# Patient Record
Sex: Female | Born: 1975 | Race: Black or African American | Hispanic: No | Marital: Single | Smoking: Never smoker
Health system: Southern US, Community
[De-identification: ages and names within clinical notes are randomized; demographics above are authoritative.]

## PROBLEM LIST (undated history)

## (undated) ENCOUNTER — Inpatient Hospital Stay (HOSPITAL_COMMUNITY): Payer: Self-pay

## (undated) DIAGNOSIS — R42 Dizziness and giddiness: Secondary | ICD-10-CM

## (undated) DIAGNOSIS — R519 Headache, unspecified: Secondary | ICD-10-CM

## (undated) DIAGNOSIS — M2550 Pain in unspecified joint: Secondary | ICD-10-CM

## (undated) DIAGNOSIS — N390 Urinary tract infection, site not specified: Secondary | ICD-10-CM

## (undated) DIAGNOSIS — M545 Low back pain, unspecified: Secondary | ICD-10-CM

## (undated) DIAGNOSIS — R600 Localized edema: Secondary | ICD-10-CM

## (undated) DIAGNOSIS — I10 Essential (primary) hypertension: Secondary | ICD-10-CM

## (undated) DIAGNOSIS — R51 Headache: Secondary | ICD-10-CM

## (undated) DIAGNOSIS — R0602 Shortness of breath: Secondary | ICD-10-CM

## (undated) DIAGNOSIS — M79602 Pain in left arm: Secondary | ICD-10-CM

## (undated) DIAGNOSIS — E739 Lactose intolerance, unspecified: Secondary | ICD-10-CM

## (undated) DIAGNOSIS — F419 Anxiety disorder, unspecified: Secondary | ICD-10-CM

## (undated) HISTORY — PX: WISDOM TOOTH EXTRACTION: SHX21

## (undated) HISTORY — DX: Low back pain, unspecified: M54.50

## (undated) HISTORY — DX: Dizziness and giddiness: R42

## (undated) HISTORY — DX: Pain in unspecified joint: M25.50

## (undated) HISTORY — DX: Lactose intolerance, unspecified: E73.9

## (undated) HISTORY — DX: Localized edema: R60.0

## (undated) HISTORY — DX: Shortness of breath: R06.02

## (undated) HISTORY — DX: Pain in left arm: M79.602

## (undated) HISTORY — PX: TONSILLECTOMY: SUR1361

## (undated) HISTORY — DX: Headache, unspecified: R51.9

## (undated) HISTORY — DX: Anxiety disorder, unspecified: F41.9

## (undated) HISTORY — DX: Essential (primary) hypertension: I10

---
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
## 1998-02-24 ENCOUNTER — Inpatient Hospital Stay (HOSPITAL_COMMUNITY): Admission: AD | Admit: 1998-02-24 | Discharge: 1998-02-24 | Payer: Self-pay | Admitting: Obstetrics & Gynecology

## 1998-03-07 ENCOUNTER — Inpatient Hospital Stay (HOSPITAL_COMMUNITY): Admission: AD | Admit: 1998-03-07 | Discharge: 1998-03-07 | Payer: Self-pay | Admitting: *Deleted

## 1998-05-15 ENCOUNTER — Emergency Department (HOSPITAL_COMMUNITY): Admission: EM | Admit: 1998-05-15 | Discharge: 1998-05-15 | Payer: Self-pay | Admitting: Emergency Medicine

## 1999-01-24 ENCOUNTER — Inpatient Hospital Stay (HOSPITAL_COMMUNITY): Admission: AD | Admit: 1999-01-24 | Discharge: 1999-01-24 | Payer: Self-pay | Admitting: Obstetrics

## 1999-04-08 ENCOUNTER — Inpatient Hospital Stay (HOSPITAL_COMMUNITY): Admission: AD | Admit: 1999-04-08 | Discharge: 1999-04-08 | Payer: Self-pay | Admitting: Obstetrics & Gynecology

## 2001-01-14 ENCOUNTER — Emergency Department (HOSPITAL_COMMUNITY): Admission: EM | Admit: 2001-01-14 | Discharge: 2001-01-14 | Payer: Self-pay | Admitting: Emergency Medicine

## 2001-02-08 ENCOUNTER — Inpatient Hospital Stay (HOSPITAL_COMMUNITY): Admission: AD | Admit: 2001-02-08 | Discharge: 2001-02-08 | Payer: Self-pay | Admitting: Obstetrics & Gynecology

## 2001-02-09 ENCOUNTER — Encounter: Payer: Self-pay | Admitting: Emergency Medicine

## 2001-02-09 ENCOUNTER — Emergency Department (HOSPITAL_COMMUNITY): Admission: EM | Admit: 2001-02-09 | Discharge: 2001-02-09 | Payer: Self-pay | Admitting: Emergency Medicine

## 2001-02-10 ENCOUNTER — Inpatient Hospital Stay (HOSPITAL_COMMUNITY): Admission: EM | Admit: 2001-02-10 | Discharge: 2001-02-11 | Payer: Self-pay | Admitting: Emergency Medicine

## 2001-02-10 ENCOUNTER — Encounter: Payer: Self-pay | Admitting: Emergency Medicine

## 2001-02-11 ENCOUNTER — Encounter: Payer: Self-pay | Admitting: Internal Medicine

## 2002-12-05 ENCOUNTER — Emergency Department (HOSPITAL_COMMUNITY): Admission: EM | Admit: 2002-12-05 | Discharge: 2002-12-05 | Payer: Self-pay | Admitting: Emergency Medicine

## 2003-05-02 ENCOUNTER — Emergency Department (HOSPITAL_COMMUNITY): Admission: EM | Admit: 2003-05-02 | Discharge: 2003-05-02 | Payer: Self-pay | Admitting: Emergency Medicine

## 2003-07-01 ENCOUNTER — Emergency Department (HOSPITAL_COMMUNITY): Admission: EM | Admit: 2003-07-01 | Discharge: 2003-07-01 | Payer: Self-pay | Admitting: Emergency Medicine

## 2003-11-10 ENCOUNTER — Encounter: Admission: RE | Admit: 2003-11-10 | Discharge: 2004-02-08 | Payer: Self-pay | Admitting: Internal Medicine

## 2004-02-20 ENCOUNTER — Emergency Department (HOSPITAL_COMMUNITY): Admission: EM | Admit: 2004-02-20 | Discharge: 2004-02-21 | Payer: Self-pay | Admitting: Emergency Medicine

## 2004-03-01 ENCOUNTER — Encounter: Admission: RE | Admit: 2004-03-01 | Discharge: 2004-05-30 | Payer: Self-pay | Admitting: Internal Medicine

## 2004-04-12 ENCOUNTER — Emergency Department (HOSPITAL_COMMUNITY): Admission: EM | Admit: 2004-04-12 | Discharge: 2004-04-12 | Payer: Self-pay | Admitting: Emergency Medicine

## 2007-12-25 ENCOUNTER — Emergency Department (HOSPITAL_COMMUNITY): Admission: EM | Admit: 2007-12-25 | Discharge: 2007-12-25 | Payer: Self-pay | Admitting: Emergency Medicine

## 2008-04-13 ENCOUNTER — Emergency Department (HOSPITAL_COMMUNITY): Admission: EM | Admit: 2008-04-13 | Discharge: 2008-04-14 | Payer: Self-pay | Admitting: Emergency Medicine

## 2008-09-22 ENCOUNTER — Emergency Department (HOSPITAL_COMMUNITY): Admission: EM | Admit: 2008-09-22 | Discharge: 2008-09-22 | Payer: Self-pay | Admitting: Emergency Medicine

## 2010-09-08 ENCOUNTER — Encounter: Payer: Self-pay | Admitting: Neurology

## 2010-09-14 ENCOUNTER — Inpatient Hospital Stay (HOSPITAL_COMMUNITY)
Admission: AD | Admit: 2010-09-14 | Discharge: 2010-09-14 | Payer: Self-pay | Source: Home / Self Care | Attending: Obstetrics and Gynecology | Admitting: Obstetrics and Gynecology

## 2010-09-14 LAB — URINE MICROSCOPIC-ADD ON

## 2010-09-14 LAB — URINALYSIS, ROUTINE W REFLEX MICROSCOPIC
Nitrite: POSITIVE — AB
Specific Gravity, Urine: 1.025 (ref 1.005–1.030)
pH: 6.5 (ref 5.0–8.0)

## 2010-09-15 LAB — GC/CHLAMYDIA PROBE AMP, GENITAL: Chlamydia, DNA Probe: NEGATIVE

## 2010-10-25 ENCOUNTER — Inpatient Hospital Stay (HOSPITAL_COMMUNITY)
Admission: AD | Admit: 2010-10-25 | Discharge: 2010-10-25 | Disposition: A | Discharge status: Home / Self Care | Payer: Self-pay | Source: Ambulatory Visit | Attending: Obstetrics & Gynecology | Admitting: Obstetrics & Gynecology

## 2010-10-25 DIAGNOSIS — N764 Abscess of vulva: Secondary | ICD-10-CM

## 2010-12-10 ENCOUNTER — Inpatient Hospital Stay (HOSPITAL_COMMUNITY)
Admission: AD | Admit: 2010-12-10 | Discharge: 2010-12-10 | Disposition: A | Discharge status: Home / Self Care | Payer: Self-pay | Source: Ambulatory Visit | Attending: Family Medicine | Admitting: Family Medicine

## 2010-12-10 DIAGNOSIS — N390 Urinary tract infection, site not specified: Secondary | ICD-10-CM

## 2010-12-10 DIAGNOSIS — R300 Dysuria: Secondary | ICD-10-CM | POA: Insufficient documentation

## 2010-12-10 LAB — URINALYSIS, ROUTINE W REFLEX MICROSCOPIC
Bilirubin Urine: NEGATIVE
Nitrite: POSITIVE — AB
Specific Gravity, Urine: >1.03 — ABNORMAL HIGH (ref 1.005–1.030)
Urobilinogen, UA: 1 mg/dL (ref 0.0–1.0)

## 2010-12-10 LAB — POCT PREGNANCY, URINE: Preg Test, Ur: NEGATIVE

## 2010-12-10 LAB — URINE MICROSCOPIC-ADD ON

## 2010-12-12 LAB — URINE CULTURE: Colony Count: >=100000

## 2011-01-02 ENCOUNTER — Inpatient Hospital Stay (HOSPITAL_COMMUNITY)
Admission: AD | Admit: 2011-01-02 | Discharge: 2011-01-02 | Disposition: A | Discharge status: Home / Self Care | Payer: Self-pay | Source: Ambulatory Visit | Attending: Family Medicine | Admitting: Family Medicine

## 2011-01-02 DIAGNOSIS — N949 Unspecified condition associated with female genital organs and menstrual cycle: Secondary | ICD-10-CM

## 2011-01-02 DIAGNOSIS — S3140XA Unspecified open wound of vagina and vulva, initial encounter: Secondary | ICD-10-CM | POA: Insufficient documentation

## 2011-01-02 LAB — WET PREP, GENITAL
Clue Cells Wet Prep HPF POC: NONE SEEN
Trich, Wet Prep: NONE SEEN
Yeast Wet Prep HPF POC: NONE SEEN

## 2011-01-02 LAB — URINALYSIS, ROUTINE W REFLEX MICROSCOPIC
Bilirubin Urine: NEGATIVE
Glucose, UA: NEGATIVE mg/dL
Hgb urine dipstick: NEGATIVE
Ketones, ur: NEGATIVE mg/dL
Nitrite: NEGATIVE
pH: 6 (ref 5.0–8.0)

## 2011-01-02 LAB — URINE MICROSCOPIC-ADD ON

## 2011-01-02 LAB — POCT PREGNANCY, URINE: Preg Test, Ur: NEGATIVE

## 2011-01-04 NOTE — Discharge Summary (Signed)
[place]  Patient:    Stacy Thompson, Stacy Thompson                     MRN: [id number] Adm. Date:  [id number] Disc. Date: 02/11/01 Attending:  Devoria Albe Dictator:   Cornell Barman, P.A. CC:         Sonda Primes, M.D. [facility]   Discharge Summary  DISCHARGE DIAGNOSIS:  Stevens-Johnson syndrome.  BRIEF ADMISSION HISTORY:  The patient is a 35 year old African-American female who presented to the emergency room on February 09, 2001.  She was noted to have ________ blistering with rash.  She also had a fever.  She was diagnosed with herpes zoster and started on acyclovir and Vicodin at that time.  The patient returned to the emergency room on February 10, 2001 with vomiting and a fever of 104 degrees rectally.  PAST MEDICAL HISTORY:  History of chicken pox when she was in the 12th grade that resulted in temporary paralysis.  ALLERGIES:  SULFA.  HOSPITAL COURSE:  #1 - DERMATOLOGY.  The patient presented with what looks like Stevens-Johnson syndrome, etiology is not clear, but it does appear that one week prior to this admission she did take a multivitamin and she also used a topical terfenadine for vaginitis on Sunday.  The patient was admitted for stabilization and transferred to [facility].  She was empirically started on acyclovir and Fortaz.  Arrangements are currently under way for the patient to be transferred to [facility].  PHYSICAL EXAMINATION AT DISCHARGE:  Temperature is 101.9, blood pressure 110/50, heart rate 115, respirations 20, and O2 saturations are 98% on room air.  In general, this is an obese African-American female in obvious discomfort, currently sedated.  HEENT reveals sluffing of all mucous membranes.  LABORATORY DATA:  These were from admission - sodium 135, potassium 3.6, chloride 104, glucose 155, creatinine 1.3.  PLAN:  The patient will be transferred to [facility] Burn Unit for further treatment and  management at the direction of Dr. Posey Rea. DD:  02/11/01 TD:  02/11/01 Job: [id number]

## 2011-01-04 NOTE — H&P (Signed)
[place]  Patient:    Stacy Thompson, Stacy Thompson                     MRN: [id number] Adm. Date:  [id number] Attending:  Devoria Albe                         History and Physical  DATE OF BIRTH:  September 18, 1975.  CHIEF COMPLAINT:  Rash, fever, and headache.  HISTORY OF PRESENT ILLNESS:  The patient is a 35 year old black female who started to have headache and somewhat sore throat on Saturday.  Later she developed fever.  On Sunday she went to [facility] and was diagnosed with vaginitis. She was prescribed topical ____________.  She came to the emergency room on February 09, 2001, with rash inside her mouth and increasing fever with headache. She was diagnosed with herpes zoster and prescribed oral acyclovir and Vicodin for pain. She continues to feel worse today. She developed rash all over her body with blisters on the back.  Her fever went up substantially and she vomited several times.  In the ER her temperature was 104.9 rectally. She received 1000 mg of Tylenol.  Triage checked her fever as 106.  CURRENT MEDICATIONS:  As above. She states that about a week ago she started to take multivitamin, like Centrum and ___________ as above as well.  PAST MEDICAL HISTORY:  Chickenpox in 12th grade.  Apparently with it, she had some facial paralysis temporary that resolved.  ALLERGIES:  SULFA.  SOCIAL HISTORY:  She is single. She does not smoke or drink alcohol.  She is a Consulting civil engineer. She has no children.  FAMILY HISTORY:  Father has hypertension.  REVIEW OF SYSTEMS:  No infectious exposure.  No exposure to animals.  No recent travel.  Family members are okay.  Medications as above.  Last menstrual period was two weeks ago.  No syncope. The rest is as above.  PHYSICAL EXAMINATION:  GENERAL APPEARANCE:  She is in moderate to severe acute distress, appears ill.  VITAL SIGNS:  Temperature 104.9, blood pressure 111/70, heart rate 135, respiratory rate 36, O2  sats 100%.  HEENT:  Severe conjunctivitis with purulent exudate.  Lips swollen with ulcerations.  Mouth with ulcerations and exudate covering her tongue and oropharynx.  NECK:  Supple. There are no meningeal signs.  SKIN:  Papular rash over her upper extremities, trunk, back, sparing the lower extremities.  There is an area of erosions in the back x 2, one measuring about 5 x 3 cm, one measuring 2 x 2 cm.  Vaginal area examined and reveals no lesions.  LUNGS:  Decreased breath sounds.  No wheezes or rales.  CARDIOVASCULAR:  S1 and S2, tachycardic.  ABDOMEN:  Soft, nontender, no organomegaly, no masses felt.  EXTREMITIES:  Lower extremities without edema.  NEUROLOGICAL:  She is alert and oriented and cooperative.  She appears tired.  LAB WORK: Chest x-ray with atelectasis. Cervical film with enlarged epiglottis.  Labs on February 09, 2001, revealed UA normal.  Pregnancy test negative.  Wet prep with few yeast.  Today her white count is 7.2, platelet count 125, hemoglobin 14.0.  Blood cultures obtained.  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  Dermatology consult, infectious    disease consult tomorrow.  May need a burn unit transfer if worse.  Will    start aggressively with IV fluids, empiric acyclovir at 10 mg/kg q.8h.  IV, empiric Fortaz 1 g q.8h.  IV steroids.  Etiology is unclear.  As I    mentioned above, she took a multivitamin about a week ago. She used    topical _____________ for vaginitis on Sunday.  Her last period was two    weeks ago. 2. Dehydration. We will use IV fluids aggressively, intensive care unit. 3. Rash, erosions, mouth ulcers.  Skin biopsy tomorrow. 4. Discussed with her parents the severity of her condition and they seem    to understand. DD:  02/11/01 TD:  02/11/01 Job: [id number]

## 2011-01-17 ENCOUNTER — Inpatient Hospital Stay (HOSPITAL_COMMUNITY)
Admission: AD | Admit: 2011-01-17 | Discharge: 2011-01-17 | Disposition: A | Discharge status: Home / Self Care | Payer: Self-pay | Source: Ambulatory Visit | Attending: Obstetrics and Gynecology | Admitting: Obstetrics and Gynecology

## 2011-01-17 ENCOUNTER — Inpatient Hospital Stay (HOSPITAL_COMMUNITY): Payer: Self-pay

## 2011-01-17 DIAGNOSIS — R109 Unspecified abdominal pain: Secondary | ICD-10-CM

## 2011-01-17 LAB — URINALYSIS, ROUTINE W REFLEX MICROSCOPIC
Nitrite: NEGATIVE
Specific Gravity, Urine: 1.025 (ref 1.005–1.030)
Urobilinogen, UA: 1 mg/dL (ref 0.0–1.0)
pH: 6 (ref 5.0–8.0)

## 2011-01-17 LAB — CBC
HCT: 38.6 % (ref 36.0–46.0)
MCH: 27.5 pg (ref 26.0–34.0)
MCV: 83.7 fL (ref 78.0–100.0)
Platelets: 223 10*3/uL (ref 150–400)
RDW: 14.1 % (ref 11.5–15.5)

## 2011-01-17 LAB — URINE MICROSCOPIC-ADD ON

## 2011-01-17 LAB — POCT PREGNANCY, URINE: Preg Test, Ur: NEGATIVE

## 2011-01-17 LAB — WET PREP, GENITAL

## 2011-01-18 LAB — GC/CHLAMYDIA PROBE AMP, GENITAL: Chlamydia, DNA Probe: NEGATIVE

## 2011-01-18 LAB — URINE CULTURE: Colony Count: 15000

## 2011-10-20 ENCOUNTER — Encounter (HOSPITAL_COMMUNITY): Payer: Self-pay | Admitting: *Deleted

## 2011-10-20 ENCOUNTER — Inpatient Hospital Stay (HOSPITAL_COMMUNITY)
Admission: AD | Admit: 2011-10-20 | Discharge: 2011-10-20 | Disposition: A | Discharge status: Home Health | Payer: Self-pay | Source: Ambulatory Visit | Attending: Obstetrics & Gynecology | Admitting: Obstetrics & Gynecology

## 2011-10-20 DIAGNOSIS — R300 Dysuria: Secondary | ICD-10-CM | POA: Insufficient documentation

## 2011-10-20 DIAGNOSIS — B373 Candidiasis of vulva and vagina: Secondary | ICD-10-CM

## 2011-10-20 DIAGNOSIS — B3731 Acute candidiasis of vulva and vagina: Secondary | ICD-10-CM | POA: Insufficient documentation

## 2011-10-20 HISTORY — DX: Urinary tract infection, site not specified: N39.0

## 2011-10-20 LAB — URINALYSIS, ROUTINE W REFLEX MICROSCOPIC
Bilirubin Urine: NEGATIVE
Ketones, ur: NEGATIVE mg/dL
Leukocytes, UA: NEGATIVE
Nitrite: NEGATIVE
Protein, ur: NEGATIVE mg/dL
Urobilinogen, UA: 0.2 mg/dL (ref 0.0–1.0)
pH: 6 (ref 5.0–8.0)

## 2011-10-20 LAB — WET PREP, GENITAL

## 2011-10-20 LAB — POCT PREGNANCY, URINE: Preg Test, Ur: NEGATIVE

## 2011-10-20 MED ORDER — FLUCONAZOLE 150 MG PO TABS: 150.0000 mg | ORAL_TABLET | Freq: Once | ORAL | Status: AC

## 2011-10-20 NOTE — ED Provider Notes (Signed)
Medical Screening exam and patient care preformed by advanced practice provider.  Agree with the above management.  

## 2011-10-20 NOTE — Progress Notes (Addendum)
Pt reports having frequent urination that has been going on for 1-2 week. Was burning a few days ago took left over pyridium.  Not burniong anymore but still having frequentcy.  Also feels like she had a yeast infection took OTC  Med for yeast but still has symptoms as well (iirtation). Pt also has an abscess on back of left leg near buttocks that is painful.

## 2011-10-20 NOTE — Progress Notes (Signed)
Pt c/o uti symptoms, left leg abcess, vag yeast infection and left eye infection for the past few days.

## 2011-10-20 NOTE — ED Provider Notes (Signed)
History     Chief Complaint  Patient presents with  . Urinary Tract Infection   HPI 36 y.o. G0P0 with "uti symptoms" x 1-2 weeks, had burning with urination, took pyridium at home and burning has since stopped, still has frequency. Also c/o vaginal discharge and irritation - tried OTC Monistat and terazol, both 3 day courses, with no relief. Also c/o "abcess" on back of left thigh, tender area, no drainage or redness. Also c/o weeping from left eye, denies pain, itching, redness, crusting. Pt also states she has not had an annual exam in several years.    Past Medical History  Diagnosis Date  . UTI (lower urinary tract infection)     Past Surgical History  Procedure Date  . Tonsillectomy   . Wisdom tooth extraction     Family History  Problem Relation Age of Onset  . Hypertension Father     History  Substance Use Topics  . Smoking status: Never Smoker   . Smokeless tobacco: Not on file  . Alcohol Use: Yes     socially    Allergies:  Allergies  Allergen Reactions  . Sulfa Antibiotics Hives, Shortness Of Breath and Swelling    No prescriptions prior to admission    Review of Systems  Constitutional: Negative.   HENT: Positive for ear discharge.   Respiratory: Negative.   Cardiovascular: Negative.   Gastrointestinal: Negative for nausea, vomiting, abdominal pain, diarrhea and constipation.  Genitourinary: Positive for urgency. Negative for dysuria, frequency, hematuria and flank pain.       Negative for vaginal bleeding, + vaginal discharge and irritation  Musculoskeletal: Negative.   Neurological: Negative.   Psychiatric/Behavioral: Negative.    Physical Exam   Blood pressure 140/87, pulse 87, temperature 98.4 F (36.9 C), temperature source Oral, resp. rate 16, height 5\' 4"  (1.626 m), weight 298 lb 12.8 oz (135.535 kg), last menstrual period 10/01/2011.  Physical Exam  Constitutional: She is oriented to person, place, and time. She appears well-developed  and well-nourished. No distress.       Morbidly obese   HENT:  Head: Normocephalic and atraumatic.  Cardiovascular: Normal rate, regular rhythm and normal heart sounds.   Respiratory: Effort normal and breath sounds normal. No respiratory distress.  GI: Soft. Bowel sounds are normal. She exhibits no distension and no mass. There is no tenderness. There is no rebound and no guarding.  Genitourinary: There is no rash or lesion on the right labia. There is no rash or lesion on the left labia. Cervix exhibits no discharge and no friability. No erythema, tenderness or bleeding around the vagina. Vaginal discharge (yeasty) found.  Neurological: She is alert and oriented to person, place, and time.  Skin: Skin is warm and dry.  Psychiatric: She has a normal mood and affect.    MAU Course  Procedures  Results for orders placed during the hospital encounter of 10/20/11 (from the past 24 hour(s))  URINALYSIS, ROUTINE W REFLEX MICROSCOPIC     Status: Abnormal   Collection Time   10/20/11  2:50 PM      Component Value Range   Color, Urine YELLOW  YELLOW    APPearance CLEAR  CLEAR    Specific Gravity, Urine >1.030 (*) 1.005 - 1.030    pH 6.0  5.0 - 8.0    Glucose, UA NEGATIVE  NEGATIVE (mg/dL)   Hgb urine dipstick NEGATIVE  NEGATIVE    Bilirubin Urine NEGATIVE  NEGATIVE    Ketones, ur NEGATIVE  NEGATIVE (mg/dL)  Protein, ur NEGATIVE  NEGATIVE (mg/dL)   Urobilinogen, UA 0.2  0.0 - 1.0 (mg/dL)   Nitrite NEGATIVE  NEGATIVE    Leukocytes, UA NEGATIVE  NEGATIVE   POCT PREGNANCY, URINE     Status: Normal   Collection Time   10/20/11  3:23 PM      Component Value Range   Preg Test, Ur NEGATIVE  NEGATIVE   WET PREP, GENITAL     Status: Abnormal   Collection Time   10/20/11  3:40 PM      Component Value Range   Yeast Wet Prep HPF POC NONE SEEN  NONE SEEN    Trich, Wet Prep NONE SEEN  NONE SEEN    Clue Cells Wet Prep HPF POC NONE SEEN  NONE SEEN    WBC, Wet Prep HPF POC FEW (*) NONE SEEN       Assessment and Plan  36 y.o. G0P0 with apparent yeast infection, rx diflucan 150 mg, 1 tab today, repeat in 2 days F/U in GYN clinic for annual exam, encouraged pt to have follow up with PCP or to request blood sugar check at annual exam  Najmo Pardue 10/20/2011, 4:28 PM

## 2011-10-20 NOTE — Discharge Instructions (Signed)
You should follow up for your annual exam. I would encourage you to have your blood sugar and cholesterol checked when you follow up.

## 2011-11-25 ENCOUNTER — Emergency Department (HOSPITAL_COMMUNITY)
Admission: EM | Admit: 2011-11-25 | Discharge: 2011-11-25 | Disposition: A | Discharge status: Home / Self Care | Payer: Self-pay | Attending: Emergency Medicine | Admitting: Emergency Medicine

## 2011-11-25 ENCOUNTER — Encounter (HOSPITAL_COMMUNITY): Payer: Self-pay | Admitting: *Deleted

## 2011-11-25 DIAGNOSIS — R51 Headache: Secondary | ICD-10-CM | POA: Insufficient documentation

## 2011-11-25 DIAGNOSIS — R509 Fever, unspecified: Secondary | ICD-10-CM | POA: Insufficient documentation

## 2011-11-25 DIAGNOSIS — J329 Chronic sinusitis, unspecified: Secondary | ICD-10-CM | POA: Insufficient documentation

## 2011-11-25 DIAGNOSIS — J3489 Other specified disorders of nose and nasal sinuses: Secondary | ICD-10-CM | POA: Insufficient documentation

## 2011-11-25 MED ORDER — AMOXICILLIN-POT CLAVULANATE 875-125 MG PO TABS: 1.0000 | ORAL_TABLET | Freq: Two times a day (BID) | ORAL | Status: AC

## 2011-11-25 NOTE — ED Provider Notes (Signed)
History     CSN: [id number]  Arrival date & time 11/25/11  1901   First MD Initiated Contact with Patient 11/25/11 1943      Chief Complaint  Patient presents with  . Facial Pain    (Consider location/radiation/quality/duration/timing/severity/associated sxs/prior treatment) HPI Comments: Patient here with a two week history of maxillary facial pain, nasal congestion and fever which started 2 days ago - she reports thick green nasal drainage now with streaks of blood as well.  Denies nausea, vomiting, diarrhea, reports mild headache along with this.  Patient is a 36 y.o. female presenting with sinusitis. The history is provided by the patient. No language interpreter was used.  Sinusitis  This is a new problem. The current episode started more than 1 week ago. The problem has been gradually worsening. The maximum temperature recorded prior to her arrival was 100 to 100.9 F. The fever has been present for 1 to 2 days. The pain is at a severity of 6/10. The pain is moderate. The pain has been constant since onset. Associated symptoms include congestion, sinus pressure and cough. Pertinent negatives include no chills, no sweats, no ear pain, no hoarse voice, no sore throat, no swollen glands and no shortness of breath. She has tried other medications for the symptoms. The treatment provided mild relief.    Past Medical History  Diagnosis Date  . UTI (lower urinary tract infection)     Past Surgical History  Procedure Date  . Tonsillectomy   . Wisdom tooth extraction     Family History  Problem Relation Age of Onset  . Hypertension Father     History  Substance Use Topics  . Smoking status: Never Smoker   . Smokeless tobacco: Not on file  . Alcohol Use: Yes     socially    OB History    Grav Para Term Preterm Abortions TAB SAB Ect Mult Living   0               Review of Systems  Constitutional: Negative for chills.  HENT: Positive for congestion and sinus pressure.  Negative for ear pain, sore throat and hoarse voice.   Respiratory: Positive for cough. Negative for shortness of breath.   All other systems reviewed and are negative.    Allergies  Sulfa antibiotics  Home Medications   Current Outpatient Rx  Name Route Sig Dispense Refill  . ACETAMINOPHEN 500 MG PO TABS Oral Take 1,000 mg by mouth every 6 (six) hours as needed. For pain    . GOODY HEADACHE PO Oral Take 1 packet by mouth daily as needed. For headache    . GLYCERIN-HYPROMELLOSE-PEG 400 0.2-0.2-1 % OP SOLN Ophthalmic Apply 1-2 drops to eye as needed. For dry eyes    . ADULT MULTIVITAMIN W/MINERALS CH Oral Take 1 tablet by mouth daily.      BP 124/81  Pulse 76  Temp(Src) 98.6 F (37 C) (Oral)  Resp 18  SpO2 100%  LMP 10/31/2011  Physical Exam  Nursing note and vitals reviewed. Constitutional: She is oriented to person, place, and time. She appears well-developed and well-nourished. No distress.  HENT:  Head: Normocephalic and atraumatic.  Right Ear: External ear normal.  Left Ear: External ear normal.  Nose: Mucosal edema and rhinorrhea present. No septal deviation or nasal septal hematoma. No epistaxis. Right sinus exhibits maxillary sinus tenderness. Right sinus exhibits no frontal sinus tenderness. Left sinus exhibits maxillary sinus tenderness. Left sinus exhibits no frontal sinus tenderness.  Mouth/Throat: Oropharynx is clear and moist. No oropharyngeal exudate.  Eyes: Conjunctivae are normal. Pupils are equal, round, and reactive to light. No scleral icterus.  Neck: Normal range of motion. Neck supple.  Cardiovascular: Normal rate, regular rhythm and normal heart sounds.  Exam reveals no gallop and no friction rub.   No murmur heard. Pulmonary/Chest: Effort normal and breath sounds normal. No respiratory distress. She exhibits no tenderness.  Abdominal: Soft. Bowel sounds are normal. She exhibits no distension. There is no tenderness.  Musculoskeletal: Normal range of  motion. She exhibits no edema and no tenderness.  Lymphadenopathy:    She has no cervical adenopathy.  Neurological: She is alert and oriented to person, place, and time. No cranial nerve deficit.  Skin: Skin is warm and dry. No rash noted. No erythema. No pallor.  Psychiatric: She has a normal mood and affect. Her behavior is normal. Judgment and thought content normal.    ED Course  Procedures (including critical care time)  Labs Reviewed - No data to display No results found.   Sinusitis    MDM  Patient with acute sinisitis to maxillary sinuses - will place on abx and patient will continue OTC medication for symptoms.       Izola Price [place], [place] 11/25/11 2011

## 2011-11-25 NOTE — Discharge Instructions (Signed)

## 2011-11-25 NOTE — ED Notes (Signed)
The pt thinks she has a sinus infection.  She has pressure in her sinus eyes draining and she has not felt well for one week

## 2011-11-25 NOTE — ED Notes (Signed)
Patient complaining of a sinus infection for the last two weeks; patient reports fever within the last 24 hours.  Patient reports sinus pressure, green drainage out of her eyes, and headache.  Patient states that she has problems with seasonal allergies, but that she only has sinus trouble every three to four years.  Patient alert and oriented x4; PERRL present. Will continue to monitor.

## 2011-11-25 NOTE — ED Notes (Signed)
Patient given discharge paperwork; went over discharge instructions with patient. Patient instructed to take Augmentin as directed and to finish entire prescription.  Patient instructed to follow up with her primary care physician and to return to the ED for new, worsening, or concerning symptoms.

## 2011-11-26 NOTE — ED Provider Notes (Signed)
Medical screening examination/treatment/procedure(s) were performed by non-physician practitioner and as supervising physician I was immediately available for consultation/collaboration.  Juliet Rude. Rubin Payor, MD 11/26/11 2256

## 2012-06-01 ENCOUNTER — Other Ambulatory Visit: Payer: Self-pay

## 2012-06-01 ENCOUNTER — Emergency Department (HOSPITAL_COMMUNITY): Payer: Self-pay

## 2012-06-01 ENCOUNTER — Encounter (HOSPITAL_COMMUNITY): Payer: Self-pay | Admitting: Emergency Medicine

## 2012-06-01 ENCOUNTER — Emergency Department (HOSPITAL_COMMUNITY)
Admission: EM | Admit: 2012-06-01 | Discharge: 2012-06-01 | Disposition: A | Discharge status: Home / Self Care | Payer: Self-pay | Attending: Emergency Medicine | Admitting: Emergency Medicine

## 2012-06-01 DIAGNOSIS — R079 Chest pain, unspecified: Secondary | ICD-10-CM | POA: Insufficient documentation

## 2012-06-01 DIAGNOSIS — R002 Palpitations: Secondary | ICD-10-CM | POA: Insufficient documentation

## 2012-06-01 DIAGNOSIS — F411 Generalized anxiety disorder: Secondary | ICD-10-CM | POA: Insufficient documentation

## 2012-06-01 DIAGNOSIS — R209 Unspecified disturbances of skin sensation: Secondary | ICD-10-CM | POA: Insufficient documentation

## 2012-06-01 DIAGNOSIS — F419 Anxiety disorder, unspecified: Secondary | ICD-10-CM

## 2012-06-01 DIAGNOSIS — R51 Headache: Secondary | ICD-10-CM | POA: Insufficient documentation

## 2012-06-01 LAB — POCT I-STAT TROPONIN I: Troponin i, poc: 0 ng/mL (ref 0.00–0.08)

## 2012-06-01 LAB — CBC
HCT: 39.3 % (ref 36.0–46.0)
Hemoglobin: 12.7 g/dL (ref 12.0–15.0)
RBC: 4.66 MIL/uL (ref 3.87–5.11)
WBC: 6.1 10*3/uL (ref 4.0–10.5)

## 2012-06-01 LAB — BASIC METABOLIC PANEL
BUN: 8 mg/dL (ref 6–23)
Chloride: 101 mEq/L (ref 96–112)
Glucose, Bld: 91 mg/dL (ref 70–99)
Potassium: 3.7 mEq/L (ref 3.5–5.1)
Sodium: 137 mEq/L (ref 135–145)

## 2012-06-01 NOTE — Discharge Instructions (Signed)
Followup with one of the resources below to establish care with a primary care provider. Be sure to discuss your anxiety with this provider. Return to the emergency department if your symptoms worsen. Anxiety and Panic Attacks Your caregiver has informed you that you are having an anxiety or panic attack. There may be many forms of this. Most of the time these attacks come suddenly and without warning. They come at any time of day, including periods of sleep, and at any time of life. They may be strong and unexplained. Although panic attacks are very scary, they are physically harmless. Sometimes the cause of your anxiety is not known. Anxiety is a protective mechanism of the body in its fight or flight mechanism. Most of these perceived danger situations are actually nonphysical situations (such as anxiety over losing a job). CAUSES  The causes of an anxiety or panic attack are many. Panic attacks may occur in otherwise healthy people given a certain set of circumstances. There may be a genetic cause for panic attacks. Some medications may also have anxiety as a side effect. SYMPTOMS  Some of the most common feelings are:  Intense terror.  Dizziness, feeling faint.  Hot and cold flashes.  Fear of going crazy.  Feelings that nothing is real.  Sweating.  Shaking.  Chest pain or a fast heartbeat (palpitations).  Smothering, choking sensations.  Feelings of impending doom and that death is near.  Tingling of extremities, this may be from over-breathing.  Altered reality (derealization).  Being detached from yourself (depersonalization). Several symptoms can be present to make up anxiety or panic attacks. DIAGNOSIS  The evaluation by your caregiver will depend on the type of symptoms you are experiencing. The diagnosis of anxiety or panic attack is made when no physical illness can be determined to be a cause of the symptoms. TREATMENT  Treatment to prevent anxiety and panic attacks  may include:  Avoidance of circumstances that cause anxiety.  Reassurance and relaxation.  Regular exercise.  Relaxation therapies, such as yoga.  Psychotherapy with a psychiatrist or therapist.  Avoidance of caffeine, alcohol and illegal drugs.  Prescribed medication. SEEK IMMEDIATE MEDICAL CARE IF:   You experience panic attack symptoms that are different than your usual symptoms.  You have any worsening or concerning symptoms. Document Released: 08/05/2005 Document Revised: 10/28/2011 Document Reviewed: 12/07/2009 [facility] Patient Information 2013 [place], [place]. Chest Pain (Nonspecific) It is often hard to give a specific diagnosis for the cause of chest pain. There is always a chance that your pain could be related to something serious, such as a heart attack or a blood clot in the lungs. You need to follow up with your caregiver for further evaluation. CAUSES   Heartburn.  Pneumonia or bronchitis.  Anxiety or stress.  Inflammation around your heart (pericarditis) or lung (pleuritis or pleurisy).  A blood clot in the lung.  A collapsed lung (pneumothorax). It can develop suddenly on its own (spontaneous pneumothorax) or from injury (trauma) to the chest.  Shingles infection (herpes zoster virus). The chest wall is composed of bones, muscles, and cartilage. Any of these can be the source of the pain.  The bones can be bruised by injury.  The muscles or cartilage can be strained by coughing or overwork.  The cartilage can be affected by inflammation and become sore (costochondritis). DIAGNOSIS  Lab tests or other studies, such as X-rays, electrocardiography, stress testing, or cardiac imaging, may be needed to find the cause of your pain.  TREATMENT  Treatment depends on what may be causing your chest pain. Treatment may include:  Acid blockers for heartburn.  Anti-inflammatory medicine.  Pain medicine for inflammatory conditions.  Antibiotics if an  infection is present.  You may be advised to change lifestyle habits. This includes stopping smoking and avoiding alcohol, caffeine, and chocolate.  You may be advised to keep your head raised (elevated) when sleeping. This reduces the chance of acid going backward from your stomach into your esophagus.  Most of the time, nonspecific chest pain will improve within 2 to 3 days with rest and mild pain medicine. HOME CARE INSTRUCTIONS   If antibiotics were prescribed, take your antibiotics as directed. Finish them even if you start to feel better.  For the next few days, avoid physical activities that bring on chest pain. Continue physical activities as directed.  Do not smoke.  Avoid drinking alcohol.  Only take over-the-counter or prescription medicine for pain, discomfort, or fever as directed by your caregiver.  Follow your caregiver's suggestions for further testing if your chest pain does not go away.  Keep any follow-up appointments you made. If you do not go to an appointment, you could develop lasting (chronic) problems with pain. If there is any problem keeping an appointment, you must call to reschedule. SEEK MEDICAL CARE IF:   You think you are having problems from the medicine you are taking. Read your medicine instructions carefully.  Your chest pain does not go away, even after treatment.  You develop a rash with blisters on your chest. SEEK IMMEDIATE MEDICAL CARE IF:   You have increased chest pain or pain that spreads to your arm, neck, jaw, back, or abdomen.  You develop shortness of breath, an increasing cough, or you are coughing up blood.  You have severe back or abdominal pain, feel nauseous, or vomit.  You develop severe weakness, fainting, or chills.  You have a fever. THIS IS AN EMERGENCY. Do not wait to see if the pain will go away. Get medical help at once. Call your local emergency services (911 in [place] Do not drive yourself to the hospital. MAKE  SURE YOU:   Understand these instructions.  Will watch your condition.  Will get help right away if you are not doing well or get worse. Document Released: 05/15/2005 Document Revised: 10/28/2011 Document Reviewed: 03/10/2008 [facility] Patient Information 2013 [place], [place].

## 2012-06-01 NOTE — ED Provider Notes (Signed)
History     CSN: [id number]  Arrival date & time 06/01/12  1605   First MD Initiated Contact with Patient 06/01/12 2046      Chief Complaint  Patient presents with  . Chest Pain    (Consider location/radiation/quality/duration/timing/severity/associated sxs/prior treatment) HPI Comments: 36 year old female with no significant past medical history presents the emergency department with intermittent chest pain for the past 5 weeks. States she has been having on-and-off chest pain lasting a couple seconds at random. The pain is on the left side of her chest described as sharp and nonradiating. She does not have to do anything in particular to alleviate the symptoms, it goes away on its own. Today she had an episode that lasted 15 seconds while she was studying with associated palpitations. She admits to being under increased stress due to studying for school. Admits to associated headache on and off for the past 5 weeks relieved by Tylenol. Denies nausea, vomiting, diaphoresis, shortness of breath, visual disturbance, lightheadedness or dizziness. She is a nonsmoker and has no family history of heart disease. Today will she was studying and sitting on her bed, she states the lateral aspect of her left side went numb for a few minutes, so she stood up and walked around and it went away.  The history is provided by the patient.    Past Medical History  Diagnosis Date  . UTI (lower urinary tract infection)     Past Surgical History  Procedure Date  . Tonsillectomy   . Wisdom tooth extraction     Family History  Problem Relation Age of Onset  . Hypertension Father     History  Substance Use Topics  . Smoking status: Never Smoker   . Smokeless tobacco: Not on file  . Alcohol Use: Yes     socially    OB History    Grav Para Term Preterm Abortions TAB SAB Ect Mult Living   0               Review of Systems  Constitutional: Negative for diaphoresis and appetite change.    HENT: Negative for neck pain and neck stiffness.   Eyes: Negative for visual disturbance.  Respiratory: Negative for cough, chest tightness, shortness of breath and wheezing.   Cardiovascular: Positive for chest pain and palpitations. Negative for leg swelling.  Gastrointestinal: Negative for nausea and vomiting.  Musculoskeletal: Negative for back pain.  Skin: Negative for rash.  Neurological: Positive for numbness and headaches. Negative for dizziness and light-headedness.  Psychiatric/Behavioral: The patient is nervous/anxious.     Allergies  Sulfa antibiotics  Home Medications   Current Outpatient Rx  Name Route Sig Dispense Refill  . ACETAMINOPHEN 500 MG PO TABS Oral Take 1,000 mg by mouth every 6 (six) hours as needed. For pain    . GOODY HEADACHE PO Oral Take 1 packet by mouth daily as needed. For headache    . GLYCERIN-HYPROMELLOSE-PEG 400 0.2-0.2-1 % OP SOLN Ophthalmic Apply 1-2 drops to eye as needed. For dry eyes    . ADULT MULTIVITAMIN W/MINERALS CH Oral Take 1 tablet by mouth daily.      BP 136/77  Pulse 84  Temp 98.1 F (36.7 C) (Oral)  Resp 18  SpO2 100%  LMP 05/28/2012  Physical Exam  Constitutional: She is oriented to person, place, and time. She appears well-developed and well-nourished. No distress.  HENT:  Head: Normocephalic and atraumatic.  Mouth/Throat: Oropharynx is clear and moist.  Eyes: Conjunctivae normal and  EOM are normal. Pupils are equal, round, and reactive to light.  Neck: Normal range of motion. Neck supple.  Cardiovascular: Normal rate, regular rhythm, normal heart sounds and intact distal pulses.        No extremity edema  Pulmonary/Chest: Effort normal and breath sounds normal. No respiratory distress. She has no wheezes. She exhibits no tenderness.  Abdominal: Soft. Bowel sounds are normal. There is no tenderness.  Musculoskeletal: Normal range of motion.  Neurological: She is alert and oriented to person, place, and time. No  sensory deficit.  Skin: Skin is warm and dry. She is not diaphoretic.  Psychiatric: Her speech is normal and behavior is normal. Thought content normal. Her mood appears anxious.    ED Course  Procedures (including critical care time)  Date: 06/01/2012  Rate: 78  Rhythm: normal sinus rhythm with sinus arrhythmia  QRS Axis: normal  Intervals: normal  ST/T Wave abnormalities: normal  Conduction Disutrbances:none  Narrative Interpretation: no stemi, normal ekg  Old EKG Reviewed: unchanged   Labs Reviewed  BASIC METABOLIC PANEL - Abnormal; Notable for the following:    GFR calc non Af Amer 88 (*)     All other components within normal limits  CBC  POCT I-STAT TROPONIN I  POCT I-STAT TROPONIN I   Dg Chest 2 View  06/01/2012  *RADIOLOGY REPORT*  Clinical Data: Left-sided chest pain.  CHEST - 2 VIEW  Comparison: None  Findings: The cardiac silhouette, mediastinal and hilar contours are normal.  The lungs are clear.  No pleural effusion.  The bony thorax is intact.  The S-shaped thoracolumbar scoliosis is noted.  IMPRESSION:  1.  No acute cardiopulmonary findings. 2.  Thoracolumbar scoliotic curvature.   Original Report Authenticated By: P. Loralie Champagne, M.D.      1. Chest pain   2. Anxiety       MDM  36 year old female with intermittent chest pain for the past 5 weeks. Nothing in specific brings on the pain, and is relieved on its own. No significant past medical history or family history. She's under increased stress and anxiety from sitting for school. I will give her a resource list to followup with primary care physician to discuss anxiety. Patient states her understanding of plan and is agreeable. Case discussed with Dr. Rulon Abide who agrees with plan of care.     Trevor Mace, PA-C 06/01/12 2121

## 2012-06-01 NOTE — ED Notes (Signed)
Pt c/o episode of left sided CP that then resolved and now pt having frontal ha and some tingling in left leg; pt denies SOB or vision change

## 2012-09-12 ENCOUNTER — Encounter (HOSPITAL_COMMUNITY): Payer: Self-pay | Admitting: Family

## 2012-09-12 ENCOUNTER — Inpatient Hospital Stay (HOSPITAL_COMMUNITY)
Admission: AD | Admit: 2012-09-12 | Discharge: 2012-09-12 | Disposition: A | Discharge status: Home / Self Care | Payer: Self-pay | Source: Ambulatory Visit | Attending: Obstetrics and Gynecology | Admitting: Obstetrics and Gynecology

## 2012-09-12 DIAGNOSIS — J029 Acute pharyngitis, unspecified: Secondary | ICD-10-CM | POA: Insufficient documentation

## 2012-09-12 DIAGNOSIS — K1370 Unspecified lesions of oral mucosa: Secondary | ICD-10-CM | POA: Insufficient documentation

## 2012-09-12 DIAGNOSIS — R059 Cough, unspecified: Secondary | ICD-10-CM | POA: Insufficient documentation

## 2012-09-12 DIAGNOSIS — J40 Bronchitis, not specified as acute or chronic: Secondary | ICD-10-CM | POA: Insufficient documentation

## 2012-09-12 DIAGNOSIS — K121 Other forms of stomatitis: Secondary | ICD-10-CM

## 2012-09-12 DIAGNOSIS — Z3201 Encounter for pregnancy test, result positive: Secondary | ICD-10-CM | POA: Insufficient documentation

## 2012-09-12 DIAGNOSIS — R05 Cough: Secondary | ICD-10-CM | POA: Insufficient documentation

## 2012-09-12 LAB — POCT PREGNANCY, URINE: Preg Test, Ur: POSITIVE — AB

## 2012-09-12 LAB — WET PREP, GENITAL: Yeast Wet Prep HPF POC: NONE SEEN

## 2012-09-12 MED ORDER — AZITHROMYCIN 250 MG PO TABS: 250.0000 mg | ORAL_TABLET | Freq: Every day | ORAL | Status: DC
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
## 2012-09-12 NOTE — MAU Note (Signed)
Pt reports she has been having sore throat, coughing, runny nose. Might have had a fever the other day. Has a cold sore in her mouth. Has been having these symptoms since she slept with a new partner 1.5 weeks ago.

## 2012-09-12 NOTE — MAU Provider Note (Signed)
Attestation of Attending Supervision of Advanced Practitioner (CNM/NP): Evaluation and management procedures were performed by the Advanced Practitioner under my supervision and collaboration.  I have reviewed the Advanced Practitioner's note and chart, and I agree with the management and plan.  Dealie Koelzer 09/12/2012 9:34 PM
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
## 2012-09-14 LAB — HERPES SIMPLEX VIRUS CULTURE
Culture: NOT DETECTED
Special Requests: NORMAL

## 2012-09-15 LAB — GC/CHLAMYDIA PROBE AMP: GC Probe RNA: NEGATIVE

## 2012-09-20 ENCOUNTER — Inpatient Hospital Stay (HOSPITAL_COMMUNITY): Payer: Self-pay

## 2012-09-20 ENCOUNTER — Encounter (HOSPITAL_COMMUNITY): Payer: Self-pay | Admitting: *Deleted

## 2012-09-20 ENCOUNTER — Inpatient Hospital Stay (HOSPITAL_COMMUNITY)
Admission: AD | Admit: 2012-09-20 | Discharge: 2012-09-20 | Disposition: A | Discharge status: Home / Self Care | Payer: Self-pay | Source: Ambulatory Visit | Attending: Obstetrics & Gynecology | Admitting: Obstetrics & Gynecology

## 2012-09-20 DIAGNOSIS — O200 Threatened abortion: Secondary | ICD-10-CM | POA: Insufficient documentation

## 2012-09-20 LAB — HCG, QUANTITATIVE, PREGNANCY: hCG, Beta Chain, Quant, S: 214 m[IU]/mL — ABNORMAL HIGH (ref <5)

## 2012-09-20 LAB — CBC
Platelets: 193 10*3/uL (ref 150–400)
RDW: 13.6 % (ref 11.5–15.5)
WBC: 7.3 10*3/uL (ref 4.0–10.5)

## 2012-09-20 NOTE — MAU Provider Note (Signed)
History     CSN: [id number]  Arrival date and time: 09/20/12 0048   None     Chief Complaint  Patient presents with  . Vaginal Bleeding  . Abdominal Cramping   HPI  Stacy Thompson is a 37 y.o. G1P0 at [redacted]w[redacted]d who presents today with vaginal bleeding and cramping X 4 days. She states that it has been as a heavy as a period, and she is passing small clots. She has not been saturating more than a pad an hour.   Past Medical History  Diagnosis Date  . UTI (lower urinary tract infection)     Past Surgical History  Procedure Date  . Tonsillectomy   . Wisdom tooth extraction     Family History  Problem Relation Age of Onset  . Hypertension Father     History  Substance Use Topics  . Smoking status: Never Smoker   . Smokeless tobacco: Not on file  . Alcohol Use: Yes     Comment: socially    Allergies:  Allergies  Allergen Reactions  . Sulfa Antibiotics Hives, Shortness Of Breath and Swelling    Prescriptions prior to admission  Medication Sig Dispense Refill  . acetaminophen (TYLENOL) 500 MG tablet Take 1,000 mg by mouth every 6 (six) hours as needed. For pain      . azithromycin (ZITHROMAX) 250 MG tablet Take 1 tablet (250 mg total) by mouth daily. Take first 2 tablets together, then 1 every day until finished.  6 tablet  0  . Glycerin-Hypromellose-PEG 400 (VISINE TEARS) 0.2-0.2-1 % SOLN Apply 1-2 drops to eye as needed. For dry eyes      . OVER THE COUNTER MEDICATION Take 30 mLs by mouth daily as needed. OTC cough syrup; unknown drug/strength        Review of Systems  Constitutional: Negative for fever and chills.  Gastrointestinal: Positive for abdominal pain (cramping). Negative for nausea, vomiting, diarrhea and constipation.  Genitourinary: Negative for dysuria and urgency.  Musculoskeletal: Negative for myalgias.  Neurological: Negative for dizziness.   Physical Exam   Blood pressure 149/76, pulse 86, temperature 97.4 F (36.3 C), temperature source  Oral, resp. rate 16, height 5\' 4"  (1.626 m), weight 294 lb 9.6 oz (133.63 kg), last menstrual period 07/30/2012.  Physical Exam  Nursing note and vitals reviewed. Constitutional: She is oriented to person, place, and time. She appears well-developed. No distress.  Cardiovascular: Normal rate.   Respiratory: Effort normal.  GI: Soft. She exhibits no distension. There is no tenderness.  Neurological: She is alert and oriented to person, place, and time.  Skin: Skin is warm.  Psychiatric: She has a normal mood and affect.    MAU Course  Procedures    Results for orders placed during the hospital encounter of 09/20/12 (from the past 24 hour(s))  CBC     Status: Abnormal   Collection Time   09/20/12  1:08 AM      Component Value Range   WBC 7.3  4.0 - 10.5 K/uL   RBC 4.25  3.87 - 5.11 MIL/uL   Hemoglobin 11.6 (*) 12.0 - 15.0 g/dL   HCT 40.9  81.1 - 91.4 %   MCV 84.9  78.0 - 100.0 fL   MCH 27.3  26.0 - 34.0 pg   MCHC 32.1  30.0 - 36.0 g/dL   RDW 78.2  95.6 - 21.3 %   Platelets 193  150 - 400 K/uL  HCG, QUANTITATIVE, PREGNANCY     Status: Abnormal  Collection Time   09/20/12  1:08 AM      Component Value Range   hCG, Beta Chain, Quant, S 214 (*) <5 mIU/mL   Assessment and Plan   1. Threatened abortion in first trimester    Repeat BHCG on Tuesday (~48 hours) Reviewed bleeding precautions and when to return Comfort measures discussed  Tawnya Crook 09/20/2012, 1:36 AM

## 2012-09-20 NOTE — MAU Note (Signed)
Patient states she has been having abdominal cramping and vaginal bleeding for the last four days. Patient states the bleeding is bright red and as heavy as a menstrual period.Marland Kitchen

## 2012-10-09 ENCOUNTER — Encounter (HOSPITAL_COMMUNITY): Payer: Self-pay | Admitting: *Deleted

## 2012-10-09 ENCOUNTER — Inpatient Hospital Stay (HOSPITAL_COMMUNITY)
Admission: AD | Admit: 2012-10-09 | Discharge: 2012-10-09 | Disposition: A | Discharge status: Home / Self Care | Payer: Self-pay | Source: Ambulatory Visit | Attending: Obstetrics & Gynecology | Admitting: Obstetrics & Gynecology

## 2012-10-09 DIAGNOSIS — N949 Unspecified condition associated with female genital organs and menstrual cycle: Secondary | ICD-10-CM | POA: Insufficient documentation

## 2012-10-09 DIAGNOSIS — R350 Frequency of micturition: Secondary | ICD-10-CM | POA: Insufficient documentation

## 2012-10-09 DIAGNOSIS — N390 Urinary tract infection, site not specified: Secondary | ICD-10-CM | POA: Insufficient documentation

## 2012-10-09 DIAGNOSIS — L293 Anogenital pruritus, unspecified: Secondary | ICD-10-CM | POA: Insufficient documentation

## 2012-10-09 DIAGNOSIS — J40 Bronchitis, not specified as acute or chronic: Secondary | ICD-10-CM | POA: Insufficient documentation

## 2012-10-09 DIAGNOSIS — R05 Cough: Secondary | ICD-10-CM | POA: Insufficient documentation

## 2012-10-09 DIAGNOSIS — B373 Candidiasis of vulva and vagina: Secondary | ICD-10-CM

## 2012-10-09 DIAGNOSIS — B3731 Acute candidiasis of vulva and vagina: Secondary | ICD-10-CM | POA: Insufficient documentation

## 2012-10-09 DIAGNOSIS — R059 Cough, unspecified: Secondary | ICD-10-CM | POA: Insufficient documentation

## 2012-10-09 HISTORY — DX: Headache: R51

## 2012-10-09 LAB — URINALYSIS, ROUTINE W REFLEX MICROSCOPIC
Ketones, ur: NEGATIVE mg/dL
Nitrite: NEGATIVE
Protein, ur: NEGATIVE mg/dL

## 2012-10-09 LAB — URINE MICROSCOPIC-ADD ON

## 2012-10-09 LAB — WET PREP, GENITAL
Trich, Wet Prep: NONE SEEN
Yeast Wet Prep HPF POC: NONE SEEN

## 2012-10-09 MED ORDER — NYSTATIN-TRIAMCINOLONE 100000-0.1 UNIT/GM-% EX OINT: TOPICAL_OINTMENT | Freq: Two times a day (BID) | CUTANEOUS | Status: DC

## 2012-10-09 MED ORDER — FLUCONAZOLE 150 MG PO TABS: ORAL_TABLET | ORAL | Status: DC

## 2012-10-09 MED ORDER — GUAIFENESIN ER 600 MG PO TB12: 1200.0000 mg | ORAL_TABLET | Freq: Two times a day (BID) | ORAL | Status: DC

## 2012-10-09 MED ORDER — AMOXICILLIN 500 MG PO TABS: 500.0000 mg | ORAL_TABLET | Freq: Two times a day (BID) | ORAL | Status: DC

## 2012-10-09 NOTE — MAU Note (Signed)
Burning when she pees started this morning, yesterday noted more frequency and urgency.  Was diagnosed with an SAB in 09-13-2022, states on [place] had passed everything- then a wk later "passed the embryo and could see the umbilical cord". Was given a prescription for a zpac  For an URI, initially seemed to work, now has productive cough with green mucous.

## 2012-10-09 NOTE — MAU Provider Note (Signed)
Attestation of Attending Supervision of Advanced Practitioner (PA/CNM/NP): Evaluation and management procedures were performed by the Advanced Practitioner under my supervision and collaboration.  I have reviewed the Advanced Practitioner's note and chart, and I agree with the management and plan.  Sariya Trickey, MD, FACOG Attending Obstetrician & Gynecologist Faculty Practice, [facility]  

## 2012-10-09 NOTE — MAU Provider Note (Signed)
History     CSN: [id number]  Arrival date and time: 10/09/12 9147   None     Chief Complaint  Patient presents with  . Urinary Frequency   HPI 37 y.o. G1P0010 with cough x 1 month, ongoing after treatment for bronchitis with z pak, productive of greenish mucous. Urinary frequency, urgency since yesterday, burning today, no back pain or n/v. Vaginal itching and discharge "for a while", no improvement with monistat or terazol.    Past Medical History  Diagnosis Date  . UTI (lower urinary tract infection)   . Headache     Past Surgical History  Procedure Laterality Date  . Tonsillectomy    . Wisdom tooth extraction      Family History  Problem Relation Age of Onset  . Hypertension Father     History  Substance Use Topics  . Smoking status: Never Smoker   . Smokeless tobacco: Never Used  . Alcohol Use: Yes     Comment: socially    Allergies:  Allergies  Allergen Reactions  . Sulfa Antibiotics Hives, Shortness Of Breath and Swelling    Prescriptions prior to admission  Medication Sig Dispense Refill  . acetaminophen (TYLENOL) 500 MG tablet Take 1,000 mg by mouth every 6 (six) hours as needed. For pain      . OVER THE COUNTER MEDICATION Take 30 mLs by mouth daily as needed. OTC cough syrup; unknown drug/strength        Review of Systems  Constitutional: Negative.  Negative for fever.  Respiratory: Positive for cough and sputum production. Negative for shortness of breath and wheezing.   Cardiovascular: Negative.   Gastrointestinal: Negative for nausea, vomiting, abdominal pain, diarrhea and constipation.  Genitourinary: Negative for dysuria, urgency, frequency, hematuria and flank pain.       Negative for vaginal bleeding, + vaginal discharge  Musculoskeletal: Negative.   Neurological: Negative.   Psychiatric/Behavioral: Negative.    Physical Exam   Blood pressure 133/76, pulse 77, temperature 98.3 F (36.8 C), temperature source Oral, resp. rate 18,  height 5\' 4"  (1.626 m), weight 290 lb 6.4 oz (131.725 kg), last menstrual period 07/30/2012, SpO2 100.00%, unknown if currently breastfeeding.  Physical Exam  Constitutional: She is oriented to person, place, and time. She appears well-developed and well-nourished. No distress.  HENT:  Head: Normocephalic and atraumatic.  Cardiovascular: Normal rate, regular rhythm and normal heart sounds.   Respiratory: Effort normal and breath sounds normal. No respiratory distress. She has no wheezes. She has no rales.  GI: Soft. Bowel sounds are normal. She exhibits no distension and no mass. There is no tenderness. There is no rebound and no guarding.  Genitourinary: There is no rash or lesion on the right labia. There is no rash or lesion on the left labia. Uterus is not deviated, not enlarged, not fixed and not tender. Cervix exhibits no motion tenderness, no discharge and no friability. Right adnexum displays no mass, no tenderness and no fullness. Left adnexum displays no mass, no tenderness and no fullness. There is tenderness around the vagina. No erythema or bleeding around the vagina. Vaginal discharge (white, cheesy) found.  Erythema, adherent white discharge, small fissures around introitus  Neurological: She is alert and oriented to person, place, and time.  Skin: Skin is warm and dry.  Psychiatric: She has a normal mood and affect.    MAU Course  Procedures Results for orders placed during the hospital encounter of 10/09/12 (from the past 24 hour(s))  URINALYSIS, ROUTINE W REFLEX  MICROSCOPIC     Status: Abnormal   Collection Time    10/09/12  7:07 AM      Result Value Range   Color, Urine YELLOW  YELLOW   APPearance CLEAR  CLEAR   Specific Gravity, Urine 1.020  1.005 - 1.030   pH 6.5  5.0 - 8.0   Glucose, UA NEGATIVE  NEGATIVE mg/dL   Hgb urine dipstick NEGATIVE  NEGATIVE   Bilirubin Urine NEGATIVE  NEGATIVE   Ketones, ur NEGATIVE  NEGATIVE mg/dL   Protein, ur NEGATIVE  NEGATIVE mg/dL    Urobilinogen, UA 0.2  0.0 - 1.0 mg/dL   Nitrite NEGATIVE  NEGATIVE   Leukocytes, UA SMALL (*) NEGATIVE  URINE MICROSCOPIC-ADD ON     Status: Abnormal   Collection Time    10/09/12  7:07 AM      Result Value Range   Squamous Epithelial / LPF FEW (*) RARE   WBC, UA 7-10  <3 WBC/hpf   Bacteria, UA RARE  RARE  POCT PREGNANCY, URINE     Status: None   Collection Time    10/09/12  7:17 AM      Result Value Range   Preg Test, Ur NEGATIVE  NEGATIVE  WET PREP, GENITAL     Status: Abnormal   Collection Time    10/09/12  8:30 AM      Result Value Range   Yeast Wet Prep HPF POC NONE SEEN  NONE SEEN   Trich, Wet Prep NONE SEEN  NONE SEEN   Clue Cells Wet Prep HPF POC NONE SEEN  NONE SEEN   WBC, Wet Prep HPF POC MODERATE (*) NONE SEEN     Assessment and Plan  37 y.o. G1P0010 with 1. Yeast vaginitis (based on clinical exam) - rx diflucan, 1 tab po now, repeat in 2 days and mycolog for external irritation 2. Cough - likely ongoing from recent bronchitis - recommend mucinex and hydration, f/u with PCP or Urgent Care if not resolving or worsening 3. ? UTI - pt c/o developing symptoms, but UA negative except for small leuk - will treat with Amox 500 mg bid x 3 days     Medication List    TAKE these medications       acetaminophen 500 MG tablet  Commonly known as:  TYLENOL  Take 1,000 mg by mouth every 6 (six) hours as needed. For pain     amoxicillin 500 MG tablet  Commonly known as:  AMOXIL  Take 1 tablet (500 mg total) by mouth 2 (two) times daily.     fluconazole 150 MG tablet  Commonly known as:  DIFLUCAN  1 tab PO x 1, repeat in 2 days     guaiFENesin 600 MG 12 hr tablet  Commonly known as:  MUCINEX  Take 2 tablets (1,200 mg total) by mouth 2 (two) times daily.     nystatin-triamcinolone ointment  Commonly known as:  MYCOLOG  Apply topically 2 (two) times daily.     OVER THE COUNTER MEDICATION  Take 30 mLs by mouth daily as needed. OTC cough syrup; unknown  drug/strength         Addalynne Golding 10/09/2012, 8:16 AM

## 2012-10-09 NOTE — MAU Note (Signed)
No bleeding since miscarriage.  Period in Dec was one day, miscarriage in January

## 2012-10-11 LAB — GC/CHLAMYDIA PROBE AMP: GC Probe RNA: NEGATIVE

## 2012-10-14 ENCOUNTER — Other Ambulatory Visit: Payer: Self-pay | Admitting: Advanced Practice Midwife

## 2012-10-14 ENCOUNTER — Telehealth (HOSPITAL_COMMUNITY): Payer: Self-pay | Admitting: *Deleted

## 2012-10-14 DIAGNOSIS — A749 Chlamydial infection, unspecified: Secondary | ICD-10-CM | POA: Insufficient documentation

## 2012-10-14 MED ORDER — AZITHROMYCIN 1 G PO PACK: 1.0000 | PACK | Freq: Once | ORAL | Status: DC

## 2014-06-20 ENCOUNTER — Encounter (HOSPITAL_COMMUNITY): Payer: Self-pay | Admitting: *Deleted

## 2014-10-19 ENCOUNTER — Emergency Department (HOSPITAL_COMMUNITY)
Admission: EM | Admit: 2014-10-19 | Discharge: 2014-10-19 | Disposition: A | Discharge status: Home / Self Care | Payer: 59 | Attending: Emergency Medicine | Admitting: Emergency Medicine

## 2014-10-19 DIAGNOSIS — R519 Headache, unspecified: Secondary | ICD-10-CM

## 2014-10-19 DIAGNOSIS — Z87440 Personal history of urinary (tract) infections: Secondary | ICD-10-CM | POA: Diagnosis not present

## 2014-10-19 DIAGNOSIS — H9201 Otalgia, right ear: Secondary | ICD-10-CM

## 2014-10-19 DIAGNOSIS — R42 Dizziness and giddiness: Secondary | ICD-10-CM | POA: Insufficient documentation

## 2014-10-19 DIAGNOSIS — Z792 Long term (current) use of antibiotics: Secondary | ICD-10-CM | POA: Diagnosis not present

## 2014-10-19 DIAGNOSIS — Z79899 Other long term (current) drug therapy: Secondary | ICD-10-CM | POA: Insufficient documentation

## 2014-10-19 DIAGNOSIS — R51 Headache: Secondary | ICD-10-CM | POA: Diagnosis not present

## 2014-10-19 DIAGNOSIS — H6121 Impacted cerumen, right ear: Secondary | ICD-10-CM | POA: Diagnosis not present

## 2014-10-19 MED ORDER — CARBAMIDE PEROXIDE 6.5 % OT SOLN: 5.0000 [drp] | Freq: Two times a day (BID) | OTIC | Status: DC

## 2014-10-19 MED ORDER — DOCUSATE SODIUM 50 MG/5ML PO LIQD
100.0000 mg | Freq: Once | ORAL | Status: AC
  Administered 2014-10-19: 100 mg via ORAL
  Filled 2014-10-19: qty 10

## 2014-10-19 NOTE — ED Notes (Signed)
UNable to loosen Cerumen. Patient made aware of the plan of care. Patient understands further instruction.

## 2014-10-19 NOTE — ED Notes (Signed)
Attempted Irrigating right ear. Unable to remove at this time.

## 2014-10-19 NOTE — ED Provider Notes (Signed)
CSN: [id number]     Arrival date & time 10/19/14  2010 History  This chart was scribed for Stacy Finner, PA-C, working with Juliet Rude. Rubin Payor, MD by Chestine Spore, ED Scribe. The patient was seen in room TR07C/TR07C at 9:28 PM.    Chief Complaint  Patient presents with  . Otalgia      The history is provided by the patient. No language interpreter was used.   HPI Comments: Ottie Neglia is a 39 y.o. female who presents to the Emergency Department complaining of right ear pain onset 2 weeks. She states that she is having associated symptoms of ear discharge, dizziness, lightheadedness, HA. Pt reports that when she stops walking it feels like she is still moving. She states that she has tried bayer, robitussin, ear softening drops with no relief for her symptoms. Pt has been using the ear softening drops for a week. She denies fever, cough and any other symptoms. Denies hx of vertigo. Denies sick contacts.    Past Medical History  Diagnosis Date  . UTI (lower urinary tract infection)   . WUJWJXBJ(478.2)    Past Surgical History  Procedure Laterality Date  . Tonsillectomy    . Wisdom tooth extraction     Family History  Problem Relation Age of Onset  . Hypertension Father    History  Substance Use Topics  . Smoking status: Never Smoker   . Smokeless tobacco: Never Used  . Alcohol Use: Yes     Comment: socially   OB History    Gravida Para Term Preterm AB TAB SAB Ectopic Multiple Living       Review of Systems  Constitutional: Negative for fever.  HENT: Positive for ear discharge and ear pain.   Respiratory: Negative for cough.   Neurological: Positive for dizziness, light-headedness and headaches.      Allergies  Sulfa antibiotics  Home Medications   Prior to Admission medications   Medication Sig Start Date End Date Taking? Authorizing Provider  acetaminophen (TYLENOL) 500 MG tablet Take 1,000 mg by mouth every 6 (six) hours as needed.  For pain    Historical Provider, MD  amoxicillin (AMOXIL) 500 MG tablet Take 1 tablet (500 mg total) by mouth 2 (two) times daily. 10/09/12   Archie Patten, CNM  azithromycin [facility]) 1 G powder Take 1 packet by mouth once. 10/14/12   Aviva Signs, CNM  carbamide peroxide (DEBROX) 6.5 % otic solution Place 5 drops into the right ear 2 (two) times daily. 10/19/14   Stacy Finner, PA-C  fluconazole (DIFLUCAN) 150 MG tablet 1 tab PO x 1, repeat in 2 days 10/09/12   Archie Patten, CNM  guaiFENesin (MUCINEX) 600 MG 12 hr tablet Take 2 tablets (1,200 mg total) by mouth 2 (two) times daily. 10/09/12   Archie Patten, CNM  nystatin-triamcinolone ointment (MYCOLOG) Apply topically 2 (two) times daily. 10/09/12   Archie Patten, CNM  OVER THE COUNTER MEDICATION Take 30 mLs by mouth daily as needed. OTC cough syrup; unknown drug/strength    Historical Provider, MD   BP 145/79 mmHg  Pulse 66  Resp 22  SpO2 99%  LMP 10/12/2014  Physical Exam  Constitutional: She is oriented to person, place, and time. She appears well-developed and well-nourished.  HENT:  Head: Normocephalic and atraumatic.  Left Ear: Tympanic membrane normal.  Nose: Nose normal.  Mouth/Throat: Uvula is midline, oropharynx is clear and moist and mucous  membranes are normal.  Positive for cerumen impaction on the right ear and the TM is occluded. Left ear is normal.   Eyes: EOM are normal. Pupils are equal, round, and reactive to light.  Neck: Normal range of motion. Neck supple.  No nuchal rigidity or meningeal signs.   Cardiovascular: Normal rate, regular rhythm and normal heart sounds.   Pulmonary/Chest: Effort normal and breath sounds normal. No respiratory distress. She has no wheezes.  Abdominal: Soft. There is no tenderness.  Musculoskeletal: Normal range of motion.  Neurological: She is alert and oriented to person, place, and time. She has normal strength. No cranial nerve deficit or sensory deficit.  Coordination normal. GCS eye subscore is 4. GCS verbal subscore is 5. GCS motor subscore is 6.  Skin: Skin is warm and dry.  Psychiatric: She has a normal mood and affect. Her behavior is normal.  Nursing note and vitals reviewed.   ED Course  Procedures   Ceruminosis is noted. Minimal wax removed by syringing and manual debridement. Instructions for home care to prevent wax buildup are given.   DIAGNOSTIC STUDIES: Oxygen Saturation is 99% on room air, normal by my interpretation.    COORDINATION OF CARE: 9:30 PM-Discussed treatment plan which includes ear irrigation, ear softening drops Rx with pt at bedside and pt agreed to plan.   Labs Review Labs Reviewed - No data to display  Imaging Review No results found.   EKG Interpretation None      MDM   Final diagnoses:  Cerumen impaction, right  Otalgia, right  Light headedness  Recurrent headache   Pt presenting to ED for right ear pain with associated light headedness and headache but pt reports hx of recurrent headaches. On exam, cerumen impaction of right ear present. No focal neuro deficit.  Cerumen impaction most likely cause of pt's reported symptoms. Doubt temporal arteritis, SAH, meningitis, or other emergent process taking place at this time. Minimal cerumen removed in ED.  Will discharge pt home with Debrox. Home care instructions provided. Advised to f/u with PCP in 2-3 days for recheck of symptoms if not improving. Return precautions provided. Pt verbalized understanding and agreement with tx plan.   I personally performed the services described in this documentation, which was scribed in my presence. The recorded information has been reviewed and is accurate.    Stacy Finnerrin O'Malley, PA-C 10/19/14 [id number]  Juliet RudeNathan R. Rubin PayorPickering, MD 10/20/14 [phone]

## 2014-10-19 NOTE — ED Notes (Signed)
Pt st's she has had pain in right ear for approx 2 weeks with drainage from ear.  St's now she is starting to feel dizzy and lightheaded

## 2015-07-17 ENCOUNTER — Emergency Department (HOSPITAL_COMMUNITY)
Admission: EM | Admit: 2015-07-17 | Discharge: 2015-07-18 | Disposition: A | Discharge status: Home / Self Care | Payer: 59 | Attending: Emergency Medicine | Admitting: Emergency Medicine

## 2015-07-17 ENCOUNTER — Emergency Department (HOSPITAL_COMMUNITY): Payer: 59

## 2015-07-17 ENCOUNTER — Encounter (HOSPITAL_COMMUNITY): Payer: Self-pay | Admitting: Emergency Medicine

## 2015-07-17 DIAGNOSIS — Z3202 Encounter for pregnancy test, result negative: Secondary | ICD-10-CM | POA: Diagnosis not present

## 2015-07-17 DIAGNOSIS — R109 Unspecified abdominal pain: Secondary | ICD-10-CM | POA: Diagnosis present

## 2015-07-17 DIAGNOSIS — E669 Obesity, unspecified: Secondary | ICD-10-CM | POA: Insufficient documentation

## 2015-07-17 DIAGNOSIS — Z87440 Personal history of urinary (tract) infections: Secondary | ICD-10-CM | POA: Diagnosis not present

## 2015-07-17 DIAGNOSIS — R110 Nausea: Secondary | ICD-10-CM | POA: Diagnosis not present

## 2015-07-17 DIAGNOSIS — R197 Diarrhea, unspecified: Secondary | ICD-10-CM | POA: Insufficient documentation

## 2015-07-17 LAB — CBC WITH DIFFERENTIAL/PLATELET
BASOS ABS: 0 10*3/uL (ref 0.0–0.1)
Basophils Relative: 0 %
Eosinophils Absolute: 0.1 10*3/uL (ref 0.0–0.7)
Eosinophils Relative: 0 %
HCT: 41.8 % (ref 36.0–46.0)
Hemoglobin: 13.3 g/dL (ref 12.0–15.0)
LYMPHS PCT: 40 %
Lymphs Abs: 5 10*3/uL — ABNORMAL HIGH (ref 0.7–4.0)
MCH: 26.8 pg (ref 26.0–34.0)
MCHC: 31.8 g/dL (ref 30.0–36.0)
MCV: 84.3 fL (ref 78.0–100.0)
Monocytes Absolute: 0.7 10*3/uL (ref 0.1–1.0)
Monocytes Relative: 6 %
NEUTROS ABS: 6.8 10*3/uL (ref 1.7–7.7)
NEUTROS PCT: 54 %
PLATELETS: 226 10*3/uL (ref 150–400)
RBC: 4.96 MIL/uL (ref 3.87–5.11)
RDW: 13.4 % (ref 11.5–15.5)
WBC: 12.6 10*3/uL — AB (ref 4.0–10.5)

## 2015-07-17 LAB — URINALYSIS, ROUTINE W REFLEX MICROSCOPIC
Glucose, UA: NEGATIVE mg/dL
Hgb urine dipstick: NEGATIVE
Ketones, ur: NEGATIVE mg/dL
LEUKOCYTES UA: NEGATIVE
NITRITE: NEGATIVE
PH: 5.5 (ref 5.0–8.0)
Protein, ur: NEGATIVE mg/dL
SPECIFIC GRAVITY, URINE: 1.035 — AB (ref 1.005–1.030)

## 2015-07-17 LAB — I-STAT BETA HCG BLOOD, ED (MC, WL, AP ONLY): I-stat hCG, quantitative: <5 m[IU]/mL (ref <5)

## 2015-07-17 NOTE — ED Notes (Signed)
Unable to draw labs 

## 2015-07-17 NOTE — ED Notes (Signed)
Requested patient to urinate. Patient walked to the bathroom. 

## 2015-07-17 NOTE — ED Provider Notes (Signed)
CSN: [id number]     Arrival date & time 07/17/15  1816 History  By signing my name below, I, Phillis HaggisGabriella Gaje, attest that this documentation has been prepared under the direction and in the presence of Tomasita CrumbleAdeleke Jakaila Norment, MD. Electronically Signed: Phillis HaggisGabriella Gaje, ED Scribe. 07/17/2015. 11:26 PM.   Chief Complaint  Patient presents with  . Flank Pain   The history is provided by the patient. No language interpreter was used.    HPI Comments: Stacy Browneremeca Kovacevic is a 39 y.o. Female with a hx of UTI who presents to the Emergency Department complaining of sharp, intermittent, non-radiating, gradually worsening right flank pain and intermittent nausea onset 1.5 weeks ago. Pt reports associated diarrhea x3 that began today. Pt reports taking leftover prednisone on Wednesday which relieved her pain, but the symptoms returned on Saturday. She denies worsening or alleviating factors. She denies vomiting, dysuria, hematuria, frequency, urgency, numbness, or weakness.   Past Medical History  Diagnosis Date  . UTI (lower urinary tract infection)   . WUJWJXBJ(478.2Headache(784.0)    Past Surgical History  Procedure Laterality Date  . Tonsillectomy    . Wisdom tooth extraction     Family History  Problem Relation Age of Onset  . Hypertension Father    Social History  Substance Use Topics  . Smoking status: Never Smoker   . Smokeless tobacco: Never Used  . Alcohol Use: Yes     Comment: socially   OB History    Gravida Para Term Preterm AB TAB SAB Ectopic Multiple Living   1 0 0 0 1 0 1 0 0 0      Review of Systems 10 Systems reviewed and all are negative for acute change except as noted in the HPI.   Allergies  Sulfa antibiotics  Home Medications   Prior to Admission medications   Medication Sig Start Date End Date Taking? Authorizing Provider  acetaminophen (TYLENOL) 500 MG tablet Take 1,000 mg by mouth every 6 (six) hours as needed. For pain   Yes Historical Provider, MD  Aspirin-Acetaminophen-Caffeine  (GOODY HEADACHE PO) Take 1 packet by mouth daily as needed (pain).   Yes Historical Provider, MD  PREDNISONE PO Take 1 tablet by mouth daily as needed (pain).   Yes Historical Provider, MD  amoxicillin (AMOXIL) 500 MG tablet Take 1 tablet (500 mg total) by mouth 2 (two) times daily. Patient not taking: Reported on 07/17/2015 10/09/12   Archie PattenNatalie K Frazier, CNM  azithromycin [facility](ZITHROMAX) 1 G powder Take 1 packet by mouth once. Patient not taking: Reported on 07/17/2015 10/14/12   Aviva SignsMarie L Williams, CNM  carbamide peroxide (DEBROX) 6.5 % otic solution Place 5 drops into the right ear 2 (two) times daily. Patient not taking: Reported on 07/17/2015 10/19/14   Junius FinnerErin O'Malley, PA-C  fluconazole (DIFLUCAN) 150 MG tablet 1 tab PO x 1, repeat in 2 days Patient not taking: Reported on 07/17/2015 10/09/12   Archie PattenNatalie K Frazier, CNM  guaiFENesin (MUCINEX) 600 MG 12 hr tablet Take 2 tablets (1,200 mg total) by mouth 2 (two) times daily. Patient not taking: Reported on 07/17/2015 10/09/12   Archie PattenNatalie K Frazier, CNM  nystatin-triamcinolone ointment [facility](MYCOLOG) Apply topically 2 (two) times daily. Patient not taking: Reported on 07/17/2015 10/09/12   Archie PattenNatalie K Frazier, CNM  OVER THE COUNTER MEDICATION Take 30 mLs by mouth daily as needed. OTC cough syrup; unknown drug/strength    Historical Provider, MD   BP 118/87 mmHg  Pulse 78  Temp(Src) 98.6 F (37 C) (Oral)  Resp 17  Ht  (1.651 m)  Wt 297 lb (134.718 kg)  BMI 49.42 kg/m2  SpO2 96%  LMP 06/28/2015 (Exact Date) Physical Exam  Constitutional: She is oriented to person, place, and time. She appears well-developed and well-nourished. No distress.  obese  HENT:  Head: Normocephalic and atraumatic.  Nose: Nose normal.  Mouth/Throat: Oropharynx is clear and moist. No oropharyngeal exudate.  Eyes: Conjunctivae and EOM are normal. Pupils are equal, round, and reactive to light. No scleral icterus.  Neck: Normal range of motion. Neck supple. No JVD present. No  tracheal deviation present. No thyromegaly present.  Cardiovascular: Normal rate, regular rhythm and normal heart sounds.  Exam reveals no gallop and no friction rub.   No murmur heard. Pulmonary/Chest: Effort normal and breath sounds normal. No respiratory distress. She has no wheezes. She exhibits no tenderness.  Abdominal: Soft. Bowel sounds are normal. She exhibits no distension and no mass. There is no tenderness. There is no rebound and no guarding.  Tenderness to the right flank  Musculoskeletal: Normal range of motion. She exhibits no edema or tenderness.  Lymphadenopathy:    She has no cervical adenopathy.  Neurological: She is alert and oriented to person, place, and time. No cranial nerve deficit. She exhibits normal muscle tone.  Skin: Skin is warm and dry. No rash noted. No erythema. No pallor.  Nursing note and vitals reviewed.   ED Course  Procedures (including critical care time) DIAGNOSTIC STUDIES: Oxygen Saturation is 96% on RA, normal by my interpretation.    COORDINATION OF CARE: 11:26 PM-Discussed treatment plan which includes labs and CT scan with pt at bedside and pt agreed to plan.    Labs Review Labs Reviewed  URINALYSIS, ROUTINE W REFLEX MICROSCOPIC (NOT AT [facility]) - Abnormal; Notable for the following:    Color, Urine AMBER (*)    APPearance CLOUDY (*)    Specific Gravity, Urine 1.035 (*)    Bilirubin Urine SMALL (*)    All other components within normal limits  CBC WITH DIFFERENTIAL/PLATELET - Abnormal; Notable for the following:    WBC 12.6 (*)    Lymphs Abs 5.0 (*)    All other components within normal limits  BASIC METABOLIC PANEL  I-STAT BETA HCG BLOOD, ED ([facility], [facility], [facility] ONLY)    Imaging Review Ct Renal Stone Study  07/18/2015  CLINICAL DATA:  Acute onset of right flank pain and nausea. Diarrhea. Initial encounter. EXAM: CT ABDOMEN AND PELVIS WITHOUT CONTRAST TECHNIQUE: Multidetector CT imaging of the abdomen and pelvis was performed following the  standard protocol without IV contrast. COMPARISON:  Pelvic ultrasound performed 09/20/2012 FINDINGS: The visualized lung bases are clear. The liver and spleen are unremarkable in appearance. The gallbladder is within normal limits. The pancreas and adrenal glands are unremarkable. Left renal parapelvic cysts are noted. The kidneys are otherwise unremarkable. There is no evidence of hydronephrosis. No renal or ureteral stones are seen. No perinephric stranding is appreciated. No free fluid is identified. The small bowel is unremarkable in appearance. The stomach is within normal limits. No acute vascular abnormalities are seen. The appendix is normal in caliber, without evidence of appendicitis. The colon is grossly unremarkable in appearance. The bladder is mildly distended and grossly unremarkable. The uterus is grossly unremarkable. The ovaries are relatively symmetric. No suspicious adnexal masses are seen. No inguinal lymphadenopathy is seen. No acute osseous abnormalities are identified. IMPRESSION: 1. No acute abnormality seen within the abdomen or pelvis. 2. Left renal parapelvic cysts noted. Electronically Signed  By: Roanna Raider M.D.   On: 07/18/2015 01:21   I have personally reviewed and evaluated these images and lab results as part of my medical decision-making.   EKG Interpretation None      MDM   Final diagnoses:  None   Patient presents to the ED for R flank pain x 1.5 weeks.  Nothing seems to bring her pain on.  She is obese and this may be musculoskeletal.  UA is negative and patient is not pregnant.  Will obtain labs and CT for evaluation for possible kidney stones.  She is not requesting anything currently for pain.  CT scan is negative, patient continues to be asymptomatic from pain. She appears well and in no acute distress, vital signs were within her normal limits and she is safe for discharge. Primary care follow-up advised within 3 days.   I personally performed the  services described in this documentation, which was scribed in my presence. The recorded information has been reviewed and is accurate.      Tomasita Crumble, MD 07/18/15 [phone]

## 2015-07-17 NOTE — ED Notes (Addendum)
Patient reports right flank pain and nausea x1.5 weeks. Diarrhea began today; 3 episodes. Denies emesis. Denies blood in urine.

## 2015-07-18 LAB — BASIC METABOLIC PANEL
Anion gap: 11 (ref 5–15)
BUN: 12 mg/dL (ref 6–20)
CO2: 24 mmol/L (ref 22–32)
CREATININE: 0.93 mg/dL (ref 0.44–1.00)
Calcium: 9.7 mg/dL (ref 8.9–10.3)
Chloride: 105 mmol/L (ref 101–111)
Glucose, Bld: 81 mg/dL (ref 65–99)
POTASSIUM: 3.5 mmol/L (ref 3.5–5.1)
SODIUM: 140 mmol/L (ref 135–145)

## 2015-07-18 NOTE — Discharge Instructions (Signed)
Flank Pain Stacy Thompson, your CT scan does not show any causes for your pain. See a primary care physician within 3 days for close follow-up. Take Tylenol or ibuprofen as needed for pain control. If any symptoms worsen come back to emergency department immediately. Thank you. Flank pain is pain in your side. The flank is the area of your side between your upper belly (abdomen) and your back. Pain in this area can be caused by many different things. HOME CARE Home care and treatment will depend on the cause of your pain.  Rest as told by your doctor.  Drink enough fluids to keep your pee (urine) clear or pale yellow.  Only take medicine as told by your doctor.  Tell your doctor about any changes in your pain.  Follow up with your doctor. GET HELP RIGHT AWAY IF:   Your pain does not get better with medicine.   You have new symptoms or your symptoms get worse.  Your pain gets worse.   You have belly (abdominal) pain.   You are short of breath.   You always feel sick to your stomach (nauseous).   You keep throwing up (vomiting).   You have puffiness (swelling) in your belly.   You feel light-headed or you pass out (faint).   You have blood in your pee.  You have a fever or lasting symptoms for more than 2-3 days.  You have a fever and your symptoms suddenly get worse. MAKE SURE YOU:   Understand these instructions.  Will watch your condition.  Will get help right away if you are not doing well or get worse.   This information is not intended to replace advice given to you by your health care provider. Make sure you discuss any questions you have with your health care provider.   Document Released: 05/14/2008 Document Revised: 08/26/2014 Document Reviewed: 03/19/2012 Elsevier Interactive Patient Education [employer]2016 [employer]

## 2015-07-18 NOTE — ED Notes (Signed)
Patient transported to CT 

## 2015-09-27 ENCOUNTER — Other Ambulatory Visit: Payer: Self-pay | Admitting: Internal Medicine

## 2015-09-27 DIAGNOSIS — Z1231 Encounter for screening mammogram for malignant neoplasm of breast: Secondary | ICD-10-CM

## 2015-12-15 ENCOUNTER — Ambulatory Visit: Payer: Self-pay

## 2015-12-28 ENCOUNTER — Inpatient Hospital Stay: Admission: RE | Admit: 2015-12-28 | Payer: Self-pay | Source: Ambulatory Visit

## 2016-11-20 ENCOUNTER — Other Ambulatory Visit: Payer: Self-pay | Admitting: Internal Medicine

## 2016-11-20 DIAGNOSIS — Z1231 Encounter for screening mammogram for malignant neoplasm of breast: Secondary | ICD-10-CM

## 2017-01-29 ENCOUNTER — Ambulatory Visit
Admission: RE | Admit: 2017-01-29 | Discharge: 2017-01-29 | Disposition: A | Discharge status: Home / Self Care | Payer: BLUE CROSS/BLUE SHIELD | Source: Ambulatory Visit | Attending: Internal Medicine | Admitting: Internal Medicine

## 2017-01-29 DIAGNOSIS — Z1231 Encounter for screening mammogram for malignant neoplasm of breast: Secondary | ICD-10-CM

## 2017-01-30 ENCOUNTER — Other Ambulatory Visit: Payer: Self-pay | Admitting: Internal Medicine

## 2017-01-30 DIAGNOSIS — R928 Other abnormal and inconclusive findings on diagnostic imaging of breast: Secondary | ICD-10-CM

## 2017-02-04 ENCOUNTER — Ambulatory Visit
Admission: RE | Admit: 2017-02-04 | Discharge: 2017-02-04 | Disposition: A | Discharge status: Home / Self Care | Payer: BLUE CROSS/BLUE SHIELD | Source: Ambulatory Visit | Attending: Internal Medicine | Admitting: Internal Medicine

## 2017-02-04 ENCOUNTER — Other Ambulatory Visit: Payer: Self-pay | Admitting: Internal Medicine

## 2017-02-04 DIAGNOSIS — N63 Unspecified lump in unspecified breast: Secondary | ICD-10-CM

## 2017-02-04 DIAGNOSIS — R928 Other abnormal and inconclusive findings on diagnostic imaging of breast: Secondary | ICD-10-CM

## 2017-08-08 ENCOUNTER — Other Ambulatory Visit: Payer: BLUE CROSS/BLUE SHIELD

## 2017-09-01 ENCOUNTER — Other Ambulatory Visit: Payer: BLUE CROSS/BLUE SHIELD

## 2017-09-12 ENCOUNTER — Other Ambulatory Visit: Payer: Self-pay | Admitting: Internal Medicine

## 2017-09-12 ENCOUNTER — Ambulatory Visit
Admission: RE | Admit: 2017-09-12 | Discharge: 2017-09-12 | Disposition: A | Discharge status: Home / Self Care | Payer: BLUE CROSS/BLUE SHIELD | Source: Ambulatory Visit | Attending: Internal Medicine | Admitting: Internal Medicine

## 2017-09-12 ENCOUNTER — Ambulatory Visit
Admission: RE | Admit: 2017-09-12 | Discharge: 2017-09-12 | Disposition: A | Discharge status: Home / Self Care | Payer: 59 | Source: Ambulatory Visit | Attending: Internal Medicine | Admitting: Internal Medicine

## 2017-09-12 DIAGNOSIS — N6320 Unspecified lump in the left breast, unspecified quadrant: Secondary | ICD-10-CM

## 2017-09-12 DIAGNOSIS — N63 Unspecified lump in unspecified breast: Secondary | ICD-10-CM

## 2018-03-04 ENCOUNTER — Other Ambulatory Visit: Payer: 59

## 2019-04-25 ENCOUNTER — Other Ambulatory Visit: Payer: Self-pay

## 2019-04-25 ENCOUNTER — Emergency Department (HOSPITAL_COMMUNITY)
Admission: EM | Admit: 2019-04-25 | Discharge: 2019-04-25 | Disposition: A | Discharge status: Home / Self Care | Payer: 59 | Attending: Emergency Medicine | Admitting: Emergency Medicine

## 2019-04-25 ENCOUNTER — Encounter (HOSPITAL_COMMUNITY): Payer: Self-pay

## 2019-04-25 DIAGNOSIS — M5416 Radiculopathy, lumbar region: Secondary | ICD-10-CM

## 2019-04-25 DIAGNOSIS — R200 Anesthesia of skin: Secondary | ICD-10-CM | POA: Diagnosis not present

## 2019-04-25 DIAGNOSIS — R202 Paresthesia of skin: Secondary | ICD-10-CM | POA: Diagnosis not present

## 2019-04-25 DIAGNOSIS — M5412 Radiculopathy, cervical region: Secondary | ICD-10-CM | POA: Insufficient documentation

## 2019-04-25 DIAGNOSIS — M79651 Pain in right thigh: Secondary | ICD-10-CM | POA: Diagnosis present

## 2019-04-25 MED ORDER — KETOROLAC TROMETHAMINE 60 MG/2ML IM SOLN
30.0000 mg | Freq: Once | INTRAMUSCULAR | Status: AC
  Administered 2019-04-25: 30 mg via INTRAMUSCULAR
  Filled 2019-04-25: qty 2

## 2019-04-25 MED ORDER — MELOXICAM 7.5 MG PO TABS: 7.5000 mg | ORAL_TABLET | Freq: Every day | ORAL | 0 refills | Status: AC

## 2019-04-25 MED ORDER — HYDROCODONE-ACETAMINOPHEN 5-325 MG PO TABS: 1.0000 | ORAL_TABLET | ORAL | 0 refills | Status: DC | PRN

## 2019-04-25 MED ORDER — HYDROCODONE-ACETAMINOPHEN 5-325 MG PO TABS: 1.0000 | ORAL_TABLET | Freq: Once | ORAL | Status: DC

## 2019-04-25 MED ORDER — METHOCARBAMOL 500 MG PO TABS: 500.0000 mg | ORAL_TABLET | Freq: Two times a day (BID) | ORAL | 0 refills | Status: DC

## 2019-04-25 MED ORDER — PREDNISONE 20 MG PO TABS
60.0000 mg | ORAL_TABLET | Freq: Once | ORAL | Status: AC
  Administered 2019-04-25: 60 mg via ORAL
  Filled 2019-04-25: qty 3

## 2019-04-25 NOTE — Discharge Instructions (Addendum)
Take Meloxicam and Robaxin as prescribed. Follow up with your doctor or neurology for further work up.

## 2019-04-25 NOTE — ED Notes (Signed)
Patient verbalizes understanding of discharge instructions . Opportunity for questions and answers were provided . Armband removed by staff ,Pt discharged from ED. W/C  offered at D/C  and Declined W/C at D/C and was escorted to lobby by RN.  

## 2019-04-25 NOTE — ED Triage Notes (Signed)
Pt arrives w R leg pain x59mo, today no pain but numbness in both legs accompanying nerve tingling in her arms and shoulders.

## 2019-04-25 NOTE — ED Provider Notes (Signed)
[facility] EMERGENCY DEPARTMENT Provider Note   CSN: [id number] Arrival date & time: 04/25/19  [id number]     History   Chief Complaint Chief Complaint  Patient presents with  . Leg Pain    HPI Stacy Thompson is a 43 y.o. female.     43yo female presents with complaint of pain to her lateral right thigh for the past month, now with pain/numbness to bilateral lateral thighs. Also having numbness to bilateral arms along deltoid area to ventral forearms, occasionally on the dorsum of the forearms. Arm symptoms improve with lying back, worse with sitting upright. Denies changes in gait, leg weakness, groin numbness, loss of bowel or bladder control, had/arm weakness. Denies falls or injuries. No other complaints or concerns.      Past Medical History:  Diagnosis Date  . Headache(784.0)   . UTI (lower urinary tract infection)     Patient Active Problem List   Diagnosis Date Noted  . Chlamydia 10/14/2012    Past Surgical History:  Procedure Laterality Date  . TONSILLECTOMY    . WISDOM TOOTH EXTRACTION       OB History    Gravida  1   Para  0   Term  0   Preterm  0   AB  1   Living  0     SAB  1   TAB  0   Ectopic  0   Multiple  0   Live Births               Home Medications    Prior to Admission medications   Medication Sig Start Date End Date Taking? Authorizing Provider  acetaminophen (TYLENOL) 500 MG tablet Take 1,000 mg by mouth every 6 (six) hours as needed. For pain    [provider]  amoxicillin (AMOXIL) 500 MG tablet Take 1 tablet (500 mg total) by mouth 2 (two) times daily. Patient not taking: Reported on 07/17/2015 10/09/12   Archie PattenFrazier, Natalie K, CNM  Aspirin-Acetaminophen-Caffeine (GOODY HEADACHE PO) Take 1 packet by mouth daily as needed (pain).    [provider]  azithromycin (ZITHROMAX) 1 G powder Take 1 packet by mouth once. Patient not taking: Reported on 07/17/2015 10/14/12   Aviva SignsWilliams, Marie L,  CNM  carbamide peroxide (DEBROX) 6.5 % otic solution Place 5 drops into the right ear 2 (two) times daily. Patient not taking: Reported on 07/17/2015 10/19/14   Lurene ShadowPhelps, Erin O, PA-C  fluconazole (DIFLUCAN) 150 MG tablet 1 tab PO x 1, repeat in 2 days Patient not taking: Reported on 07/17/2015 10/09/12   Archie PattenFrazier, Natalie K, CNM  guaiFENesin (MUCINEX) 600 MG 12 hr tablet Take 2 tablets (1,200 mg total) by mouth 2 (two) times daily. Patient not taking: Reported on 07/17/2015 10/09/12   Archie PattenFrazier, Natalie K, CNM  meloxicam (MOBIC) 7.5 MG tablet Take 1 tablet (7.5 mg total) by mouth daily for 10 days. 04/25/19 05/05/19  Jeannie FendMurphy, Laura A, PA-C  methocarbamol (ROBAXIN) 500 MG tablet Take 1 tablet (500 mg total) by mouth 2 (two) times daily. 04/25/19   Jeannie FendMurphy, Laura A, PA-C  nystatin-triamcinolone ointment (MYCOLOG) Apply topically 2 (two) times daily. Patient not taking: Reported on 07/17/2015 10/09/12   Archie PattenFrazier, Natalie K, CNM  OVER THE COUNTER MEDICATION Take 30 mLs by mouth daily as needed. OTC cough syrup; unknown drug/strength    [provider]  PREDNISONE PO Take 1 tablet by mouth daily as needed (pain).    [provider]  Family History Family History  Problem Relation Age of Onset  . Hypertension Father   . Breast cancer Neg Hx     Social History Social History   Tobacco Use  . Smoking status: Never Smoker  . Smokeless tobacco: Never Used  Substance Use Topics  . Alcohol use: Yes    Comment: socially  . Drug use: No     Allergies   Sulfa antibiotics   Review of Systems Review of Systems  Constitutional: Negative for fever.  Respiratory: Negative for shortness of breath.   Cardiovascular: Negative for chest pain.  Musculoskeletal: Positive for myalgias. Negative for back pain and neck pain.  Skin: Negative for color change, rash and wound.  Allergic/Immunologic: Negative for immunocompromised state.  Neurological: Positive for numbness. Negative for weakness.   Hematological: Negative for adenopathy.  Psychiatric/Behavioral: Negative for confusion.  All other systems reviewed and are negative.    Physical Exam Updated Vital Signs BP 124/72 (BP Location: Right Arm)   Pulse 80   Temp 97.8 F (36.6 C) (Oral)   Resp 18   Ht 5\' 5"  (1.651 m)   Wt (!) 146.1 kg   SpO2 100%   BMI 53.58 kg/m   Physical Exam Vitals signs and nursing note reviewed.  Constitutional:      General: She is not in acute distress.    Appearance: She is well-developed. She is not diaphoretic.  HENT:     Head: Normocephalic and atraumatic.  Pulmonary:     Effort: Pulmonary effort is normal.  Musculoskeletal:        General: No swelling, tenderness, deformity or signs of injury.     Right lower leg: No edema.     Left lower leg: No edema.  Skin:    General: Skin is warm and dry.     Findings: No erythema or rash.  Neurological:     Mental Status: She is alert and oriented to person, place, and time.     Sensory: Sensation is intact.     Motor: No weakness.     Deep Tendon Reflexes: Babinski sign absent on the right side. Babinski sign absent on the left side.     Reflex Scores:      Tricep reflexes are 1+ on the right side and 1+ on the left side.      Bicep reflexes are 1+ on the right side and 1+ on the left side.      Brachioradialis reflexes are 2+ on the right side and 2+ on the left side.      Patellar reflexes are 1+ on the right side and 1+ on the left side.      Achilles reflexes are 1+ on the right side and 1+ on the left side. Psychiatric:        Behavior: Behavior normal.      ED Treatments / Results  Labs (all labs ordered are listed, but only abnormal results are displayed) Labs Reviewed - No data to display  EKG None  Radiology No results found.  Procedures Procedures (including critical care time)  Medications Ordered in ED Medications  ketorolac (TORADOL) injection 30 mg (30 mg Intramuscular Given 04/25/19 1021)  predniSONE  (DELTASONE) tablet 60 mg (60 mg Oral Given 04/25/19 1021)     Initial Impression / Assessment and Plan / ED Course  I have reviewed the triage vital signs and the nursing notes.  Pertinent labs & imaging results that were available during my care of the patient were  reviewed by me and considered in my medical decision making (see chart for details).  Clinical Course as of Apr 25 1243  Wynelle Link Apr 25, 2019  [id number] 43 year old female presents with complaint of pain and numbness to the lateral thighs bilaterally as well as her upper extremities, no associated injuries or falls.  On exam patient has equal leg strength and arm strength, reflexes symmetric, pulses present and equal.  Suspect cervical and lumbar radiculopathy.  Patient was given prednisone and Toradol while in the ER, discharged with meloxicam and Robaxin advised to follow-up with PCP or contact neurology for further evaluation and treatment.   [LM]    Clinical Course User Index [LM] Jeannie Fend, PA-C      Final Clinical Impressions(s) / ED Diagnoses   Final diagnoses:  Cervical radiculopathy  Lumbar radiculopathy    ED Discharge Orders         Ordered    HYDROcodone-acetaminophen (NORCO/VICODIN) 5-325 MG tablet  Every 4 hours PRN,   Status:  Discontinued     04/25/19 1024    methocarbamol (ROBAXIN) 500 MG tablet  2 times daily     04/25/19 1038    meloxicam (MOBIC) 7.5 MG tablet  Daily     04/25/19 1038           Alden Hipp 04/25/19 1244    Gerhard Munch, MD 04/26/19 1408

## 2019-07-12 ENCOUNTER — Other Ambulatory Visit: Payer: Self-pay

## 2019-07-12 ENCOUNTER — Emergency Department (HOSPITAL_COMMUNITY)
Admission: EM | Admit: 2019-07-12 | Discharge: 2019-07-12 | Disposition: A | Discharge status: Home / Self Care | Payer: 59 | Attending: Emergency Medicine | Admitting: Emergency Medicine

## 2019-07-12 DIAGNOSIS — J019 Acute sinusitis, unspecified: Secondary | ICD-10-CM | POA: Diagnosis not present

## 2019-07-12 DIAGNOSIS — R05 Cough: Secondary | ICD-10-CM

## 2019-07-12 DIAGNOSIS — R059 Cough, unspecified: Secondary | ICD-10-CM

## 2019-07-12 MED ORDER — ALBUTEROL SULFATE HFA 108 (90 BASE) MCG/ACT IN AERS: 1.0000 | INHALATION_SPRAY | Freq: Four times a day (QID) | RESPIRATORY_TRACT | 0 refills | Status: DC | PRN

## 2019-07-12 MED ORDER — AMOXICILLIN-POT CLAVULANATE 875-125 MG PO TABS: 1.0000 | ORAL_TABLET | Freq: Two times a day (BID) | ORAL | 0 refills | Status: DC

## 2019-07-12 NOTE — ED Triage Notes (Signed)
Pt reports cough, runny nose, sneezing x 1 week. Pt also reports that she has had a sore throat for about two days and she had a brief episode of dizziness yesterday. Reports she was tested negative for COVID about 2.5 weeks ago. No known positive COVID contacts that patient is aware of. Pt reporting some body aches about two days ago.

## 2019-07-12 NOTE — ED Provider Notes (Signed)
[facility] EMERGENCY DEPARTMENT Provider Note   CSN: [id number] Arrival date & time: 07/12/19  1031     History   Chief Complaint Chief Complaint  Patient presents with  . Cough  . Nasal Congestion    HPI Stacy Thompson is a 43 y.o. female who presents with a cough. She states that for the past 2.5 weeks she's had a cough which is sometimes dry, sometimes productive, runny nose and nasal congestion, some sinus pressure, intermittent SOB and wheezing, and a sore throat. The cough sometimes has blood streaks in it. Her symptoms started with some abdominal cramping and she was tested for COVID and it was negative. Her URI symptoms started a couple days after that. Yesterday she got real dizzy and this is what scared her and she decided to come to the ED to get checked. She states that she is prone to getting sinusitis and bronchitis. She has allergies but usually it's in the spring. She denies fever or chills but has had some sweats and body aches. No current chest pain or SOB. She does not smoke.     HPI  Past Medical History:  Diagnosis Date  . Headache(784.0)   . UTI (lower urinary tract infection)     Patient Active Problem List   Diagnosis Date Noted  . Chlamydia 10/14/2012    Past Surgical History:  Procedure Laterality Date  . TONSILLECTOMY    . WISDOM TOOTH EXTRACTION       OB History    Gravida  1   Para  0   Term  0   Preterm  0   AB  1   Living  0     SAB  1   TAB  0   Ectopic  0   Multiple  0   Live Births               Home Medications    Prior to Admission medications   Medication Sig Start Date End Date Taking? Authorizing Provider  acetaminophen (TYLENOL) 500 MG tablet Take 1,000 mg by mouth every 6 (six) hours as needed. For pain    [provider]  amoxicillin (AMOXIL) 500 MG tablet Take 1 tablet (500 mg total) by mouth 2 (two) times daily. Patient not taking: Reported on 07/17/2015 10/09/12    Rosezella Rumpf, CNM  Aspirin-Acetaminophen-Caffeine (GOODY HEADACHE PO) Take 1 packet by mouth daily as needed (pain).    [provider]  azithromycin (ZITHROMAX) 1 G powder Take 1 packet by mouth once. Patient not taking: Reported on 07/17/2015 10/14/12   Seabron Spates, CNM  carbamide peroxide (DEBROX) 6.5 % otic solution Place 5 drops into the right ear 2 (two) times daily. Patient not taking: Reported on 07/17/2015 10/19/14   Noe Gens, PA-C  fluconazole (DIFLUCAN) 150 MG tablet 1 tab PO x 1, repeat in 2 days Patient not taking: Reported on 07/17/2015 10/09/12   Rosezella Rumpf, CNM  guaiFENesin (MUCINEX) 600 MG 12 hr tablet Take 2 tablets (1,200 mg total) by mouth 2 (two) times daily. Patient not taking: Reported on 07/17/2015 10/09/12   Rosezella Rumpf, CNM  methocarbamol (ROBAXIN) 500 MG tablet Take 1 tablet (500 mg total) by mouth 2 (two) times daily. 04/25/19   Tacy Learn, PA-C  nystatin-triamcinolone ointment (MYCOLOG) Apply topically 2 (two) times daily. Patient not taking: Reported on 07/17/2015 10/09/12   Rosezella Rumpf, CNM  OVER THE COUNTER MEDICATION Take 30 mLs  by mouth daily as needed. OTC cough syrup; unknown drug/strength    [provider]  PREDNISONE PO Take 1 tablet by mouth daily as needed (pain).    [provider]    Family History Family History  Problem Relation Age of Onset  . Hypertension Father   . Breast cancer Neg Hx     Social History Social History   Tobacco Use  . Smoking status: Never Smoker  . Smokeless tobacco: Never Used  Substance Use Topics  . Alcohol use: Yes    Comment: socially  . Drug use: No     Allergies   Clindamycin/lincomycin and Sulfa antibiotics   Review of Systems Review of Systems  Constitutional: Positive for diaphoresis. Negative for chills and fever.  HENT: Positive for congestion, rhinorrhea, sinus pressure and sore throat. Negative for ear pain.   Respiratory:  Positive for cough, shortness of breath and wheezing.   Cardiovascular: Negative for chest pain.  Gastrointestinal: Negative for abdominal pain, diarrhea, nausea and vomiting.  Musculoskeletal: Positive for myalgias.  Neurological: Negative for headaches.     Physical Exam Updated Vital Signs BP (!) 138/96 (BP Location: Left Arm)   Pulse 87   Temp 98.3 F (36.8 C) (Oral)   Resp 16   SpO2 97%   Physical Exam Vitals signs and nursing note reviewed.  Constitutional:      General: She is not in acute distress.    Appearance: She is well-developed. She is obese. She is not ill-appearing.  HENT:     Head: Normocephalic and atraumatic.     Right Ear: Tympanic membrane normal.     Left Ear: Tympanic membrane normal.     Nose: Nose normal.     Mouth/Throat:     Mouth: Mucous membranes are moist.  Eyes:     General: No scleral icterus.       Right eye: No discharge.        Left eye: No discharge.     Conjunctiva/sclera: Conjunctivae normal.     Pupils: Pupils are equal, round, and reactive to light.  Neck:     Musculoskeletal: Normal range of motion.  Cardiovascular:     Rate and Rhythm: Normal rate and regular rhythm.  Pulmonary:     Effort: Pulmonary effort is normal. No respiratory distress.     Breath sounds: Normal breath sounds.  Abdominal:     General: There is no distension.  Skin:    General: Skin is warm and dry.  Neurological:     Mental Status: She is alert and oriented to person, place, and time.  Psychiatric:        Behavior: Behavior normal.      ED Treatments / Results  Labs (all labs ordered are listed, but only abnormal results are displayed) Labs Reviewed - No data to display  EKG None  Radiology No results found.  Procedures Procedures (including critical care time)  Medications Ordered in ED Medications - No data to display   Initial Impression / Assessment and Plan / ED Course  I have reviewed the triage vital signs and the nursing  notes.  Pertinent labs & imaging results that were available during my care of the patient were reviewed by me and considered in my medical decision making (see chart for details).  43 year old female presents with URI symptoms and a cough is sometimes productive with bloody streaks in it that have been ongoing for several weeks.  She is hypertensive but otherwise vital signs  are normal.  On exam she is overall well-appearing.  Heart is regular rate and rhythm.  Lungs are clear to auscultation.  HEENT exam is unremarkable.  Due to prolonged symptoms she was offered a chest x-ray but declined.  We will treat for sinusitis and provide her an inhaler to use as needed for wheezing and shortness of breath.  She is advised to return if worsening.  Final Clinical Impressions(s) / ED Diagnoses   Final diagnoses:  Acute non-recurrent sinusitis, unspecified location  Cough    ED Discharge Orders         Ordered    amoxicillin-clavulanate (AUGMENTIN) 875-125 MG tablet  2 times daily     07/12/19 1234    albuterol (VENTOLIN HFA) 108 (90 Base) MCG/ACT inhaler  Every 6 hours PRN     07/12/19 1234           Bethel BornGekas, Patirica Longshore Marie, PA-C 07/12/19 1359    Benjiman CorePickering, Nathan, MD 07/12/19 1529

## 2019-07-12 NOTE — Discharge Instructions (Signed)
Take Augmentin twice daily for 5 days Use inhaler as needed for shortness of breath or wheezing Try Flonase for nasal congestion and sneezing Return if worsening

## 2020-03-21 ENCOUNTER — Ambulatory Visit (INDEPENDENT_AMBULATORY_CARE_PROVIDER_SITE_OTHER): Payer: 59 | Admitting: Family Medicine

## 2020-03-21 ENCOUNTER — Other Ambulatory Visit: Payer: Self-pay

## 2020-03-21 ENCOUNTER — Encounter (INDEPENDENT_AMBULATORY_CARE_PROVIDER_SITE_OTHER): Payer: Self-pay | Admitting: Family Medicine

## 2020-03-21 VITALS — BP 152/78 | HR 79 | Temp 98.1°F | Ht 64.0 in | Wt 336.0 lb

## 2020-03-21 DIAGNOSIS — R600 Localized edema: Secondary | ICD-10-CM | POA: Diagnosis not present

## 2020-03-21 DIAGNOSIS — Z1331 Encounter for screening for depression: Secondary | ICD-10-CM

## 2020-03-21 DIAGNOSIS — R519 Headache, unspecified: Secondary | ICD-10-CM

## 2020-03-21 DIAGNOSIS — Z6841 Body Mass Index (BMI) 40.0 and over, adult: Secondary | ICD-10-CM

## 2020-03-21 DIAGNOSIS — F3289 Other specified depressive episodes: Secondary | ICD-10-CM

## 2020-03-21 DIAGNOSIS — G8929 Other chronic pain: Secondary | ICD-10-CM

## 2020-03-21 DIAGNOSIS — R0602 Shortness of breath: Secondary | ICD-10-CM | POA: Diagnosis not present

## 2020-03-21 DIAGNOSIS — R5383 Other fatigue: Secondary | ICD-10-CM | POA: Diagnosis not present

## 2020-03-21 DIAGNOSIS — Z9189 Other specified personal risk factors, not elsewhere classified: Secondary | ICD-10-CM

## 2020-03-21 DIAGNOSIS — Z0289 Encounter for other administrative examinations: Secondary | ICD-10-CM

## 2020-03-21 DIAGNOSIS — I10 Essential (primary) hypertension: Secondary | ICD-10-CM | POA: Diagnosis not present

## 2020-03-21 NOTE — Progress Notes (Signed)
Chief Complaint:   OBESITY Stacy Thompson (MR# [id number]) is a 44 y.o. female who presents for evaluation and treatment of obesity and related comorbidities. Current BMI is Body mass index is 57.67 kg/m. Stacy Thompson has been struggling with her weight for many years and has been unsuccessful in either losing weight, maintaining weight loss, or reaching her healthy weight goal.  Stacy Thompson is currently in the action stage of change and ready to dedicate time achieving and maintaining a healthier weight. Stacy Thompson is interested in becoming our patient and working on intensive lifestyle modifications including (but not limited to) diet and exercise for weight loss.  Stacy Thompson lives with her father and sister.  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  She is currently at her heaviest.  Low carb worked best for her in the past.  She says she lost 30 pounds, but when she ate what she "wanted", she gained it all back.  She snacks on chips, Little Debbie cakes.  Craves pasta, soda, chips, burgers.  She skips breakfast 3-4 days per week.  She says she drinks juices, smoothies, and coffee with flavored creamer.  Stacy Thompson's habits were reviewed today and are as follows: Her family eats meals together, she thinks her family will eat healthier with her, she struggles with family and or coworkers weight loss sabotage, her desired weight loss is 163 pounds, she has been heavy most of her life, she started gaining weight after she had her tonsils removed, her heaviest weight ever was 333 pounds, she craves pasta, soda, chips, and burgers, she skips breakfast frequently, she is frequently drinking liquids with calories, she frequently makes poor food choices, she has problems with excessive hunger, she frequently eats larger portions than normal and she struggles with emotional eating.  Depression Screen Stacy Thompson's Food and Mood (modified PHQ-9) score was 13.  Depression screen PHQ 2/9 03/21/2020    Decreased Interest 3  Down, Depressed, Hopeless 2  PHQ - 2 Score 5  Altered sleeping 2  Tired, decreased energy 3  Change in appetite 1  Feeling bad or failure about yourself  1  Trouble concentrating 0  Moving slowly or fidgety/restless 0  Suicidal thoughts 1  PHQ-9 Score 13  Difficult doing work/chores Somewhat difficult   Subjective:   1. Other fatigue Myrah admits to daytime somnolence and reports waking up still tired. Patent has a history of symptoms of daytime fatigue, morning fatigue and morning headache. Stacy Thompson generally gets 6-8 hours of sleep per night, and states that she has generally restful sleep. Snoring is not present. Apneic episodes are not present. Epworth Sleepiness Score is 4.  2. SOB (shortness of breath) on exertion Stacy Thompson notes increasing shortness of breath with exercising and seems to be worsening over time with weight gain. She notes getting out of breath sooner with activity than she used to. This has gotten worse recently. Stacy Thompson denies shortness of breath at rest or orthopnea.  3. Essential hypertension Review: taking medications as instructed, no medication side effects noted, no chest pain on exertion, no dyspnea on exertion, no swelling of ankles.  [name] has not been on blood pressure medication in a year or more because atenolol caused weight gain and decreased energy levels.  BP Readings from Last 3 Encounters:  03/21/20 (!) 152/78  07/12/19 (!) 150/90  04/25/19 124/72   4. Bilateral lower extremity edema Chronic.  No acute CHF type symptoms.  5. Chronic nonintractable headache, unspecified headache  type Xena has frequent headaches.  6. Other depression with emotional eating Stacy Thompson is struggling with emotional eating and using food for comfort to the extent that it is negatively impacting her health. She has been working on behavior modification techniques to help reduce her emotional eating and has been unsuccessful. She shows no sign of  suicidal or homicidal ideations.  7. At risk for diabetes mellitus Stacy Thompson is at higher than average risk for developing diabetes due to her obesity.   Assessment/Plan:   1. Other fatigue Stacy Thompson does feel that her weight is causing her energy to be lower than it should be. Fatigue may be related to obesity, depression or many other causes. Labs will be ordered, and in the meanwhile, Stacy Thompson will focus on self care including making healthy food choices, increasing physical activity and focusing on stress reduction. - EKG 12-Lead - Hemoglobin A1c - Insulin, random - VITAMIN D 25 Hydroxy (Vit-D Deficiency, Fractures) - Vitamin B12 - Folate - TSH - T4 - T3  2. SOB (shortness of breath) on exertion Stacy Thompson does feel that her weight is causing her energy to be lower than it should be. Fatigue may be related to obesity, depression or many other causes. Labs will be ordered, and in the meanwhile, Stacy Thompson will focus on self care including making healthy food choices, increasing physical activity and focusing on stress reduction.  3. Essential hypertension Stacy Thompson is working on healthy weight loss and exercise to improve blood pressure control. We will watch for signs of hypotension as she continues her lifestyle modifications.  Stacy Thompson says she will buy a blood pressure cuff and check her blood pressure every other day and write it down and bring in the log to her next office visit.  She was also asked to follow up with her PCP for further mgt of her BP if it remains elevated. Continue prudent nutritional plan, decrease dietary salt, weight loss.  Will continue to monitor. - Comprehensive metabolic panel - CBC with Differential/Platelet - Lipid Panel With LDL/HDL Ratio  4. Bilateral lower extremity edema We will continue to monitor.  5. Chronic nonintractable headache, unspecified headache type Stacy Thompson will make a follow-up appointment with her PCP as she has not seen them in many, many months.  6.  Other depression with emotional eating Patient was referred to Dr. Dewaine Conger, our Bariatric Psychologist, for evaluation due to her elevated PHQ-9 score and significant struggles with emotional eating.  7. At risk for diabetes mellitus Charde was given approximately 15 minutes of diabetes education and counseling today. We discussed intensive lifestyle modifications today with an emphasis on weight loss as well as increasing exercise and decreasing simple carbohydrates in her diet. We also reviewed medication options with an emphasis on risk versus benefit of those discussed.   Repetitive spaced learning was employed today to elicit superior memory formation and behavioral change.  8. Class 3 severe obesity with serious comorbidity and body mass index (BMI) of 50.0 to 59.9 in adult, unspecified obesity type (HCC) Ovella is currently in the action stage of change and her goal is to continue with weight loss efforts. I recommend Shaguana begin the structured treatment plan as follows:  She has agreed to the Category 2 Plan.  Exercise goals: As is.   Behavioral modification strategies: increasing lean protein intake, increasing vegetables, decreasing liquid calories, no skipping meals and planning for success.  She was informed of the importance of frequent follow-up visits to maximize her success with intensive lifestyle modifications for her  multiple health conditions. She was informed we would discuss her lab results at her next visit unless there is a critical issue that needs to be addressed sooner. Versia agreed to keep her next visit at the agreed upon time to discuss these results.  Objective:   Blood pressure (!) 152/78, pulse 79, temperature 98.1 F (36.7 C), temperature source Oral, height 5\' 4"  (1.626 m), weight (!) 336 lb (152.4 kg), SpO2 100 %, unknown if currently breastfeeding. Body mass index is 57.67 kg/m.  EKG: Normal sinus rhythm, rate 77 bpm.  Indirect Calorimeter completed  today shows a VO2 of 280 and a REE of 1946.  Her calculated basal metabolic rate is thus her basal metabolic rate is worse than expected.  General: Cooperative, alert, well developed, in no acute distress. HEENT: Conjunctivae and lids unremarkable. Cardiovascular: Regular rhythm.  Lungs: Normal work of breathing. Neurologic: No focal deficits.   Lab Results  Component Value Date   CREATININE 0.78 03/21/2020   BUN 10 03/21/2020   NA 138 03/21/2020   K 4.2 03/21/2020   CL 100 03/21/2020   CO2 23 03/21/2020   Lab Results  Component Value Date   WBC 9.9 03/21/2020   HGB 12.5 03/21/2020   HCT 37.7 03/21/2020   MCV 79 03/21/2020   PLT 247 03/21/2020   Attestation Statements:   Reviewed by clinician on day of visit: allergies, medications, problem list, medical history, surgical history, family history, social history, and previous encounter notes.  I, 05/21/2020, CMA, am acting as Insurance claims handler for Energy manager, DO.  I have reviewed the above documentation for accuracy and completeness, and I agree with the above. [employer], DO

## 2020-03-21 NOTE — Patient Instructions (Signed)
Your goal blood pressure should be around 135/85 or less on a regular basis.    However, if you are 46 years old or greater, then higher blood pressures are often tolerated based on your medical conditions.  Please follow the recommendations discussed with you by your primary care provider and/or cardiologist.       Hypertension Hypertension, commonly called high blood pressure, is when the force of blood pumping through the arteries is too strong. The arteries are the blood vessels that carry blood from the heart throughout the body. Hypertension forces the heart to work harder to pump blood and may cause arteries to become narrow or stiff. Having untreated or uncontrolled hypertension can cause heart attacks, strokes, kidney disease, and other problems. A blood pressure reading consists of a higher number over a lower number. Ideally, your blood pressure should be below 120/80. The first ("top") number is called the systolic pressure. It is a measure of the pressure in your arteries as your heart beats. The second ("bottom") number is called the diastolic pressure. It is a measure of the pressure in your arteries as the heart relaxes. What are the causes? The cause of this condition is not known. What increases the risk? Some risk factors for high blood pressure are under your control. Others are not. Factors you can change  Smoking.  Having type 2 diabetes mellitus, high cholesterol, or both.  Not getting enough exercise or physical activity.  Being overweight.  Having too much fat, sugar, calories, or salt (sodium) in your diet.  Drinking too much alcohol. Factors that are difficult or impossible to change  Having chronic kidney disease.  Having a family history of high blood pressure.  Age. Risk increases with age.  Race. You may be at higher risk if you are African-American.  Gender. Men are at higher risk than women before age 45. After age 46, women are at higher risk than  men.  Having obstructive sleep apnea.  Stress. What are the signs or symptoms? Extremely high blood pressure (hypertensive crisis) may cause:  Headache.  Anxiety.  Shortness of breath.  Nosebleed.  Nausea and vomiting.  Severe chest pain.  Jerky movements you cannot control (seizures).  How is this diagnosed? This condition is diagnosed by measuring your blood pressure while you are seated, with your arm resting on a surface. The cuff of the blood pressure monitor will be placed directly against the skin of your upper arm at the level of your heart. It should be measured at least twice using the same arm. Certain conditions can cause a difference in blood pressure between your right and left arms. Certain factors can cause blood pressure readings to be lower or higher than normal (elevated) for a short period of time:  When your blood pressure is higher when you are in a health care provider's office than when you are at home, this is called white coat hypertension. Most people with this condition do not need medicines.  When your blood pressure is higher at home than when you are in a health care provider's office, this is called masked hypertension. Most people with this condition may need medicines to control blood pressure.  If you have a high blood pressure reading during one visit or you have normal blood pressure with other risk factors:  You may be asked to return on a different day to have your blood pressure checked again.  You may be asked to monitor your blood pressure at home   for 1 week or longer.  If you are diagnosed with hypertension, you may have other blood or imaging tests to help your health care provider understand your overall risk for other conditions. How is this treated? This condition is treated by making healthy lifestyle changes, such as eating healthy foods, exercising more, and reducing your alcohol intake. Your health care provider may prescribe  medicine if lifestyle changes are not enough to get your blood pressure under control, and if:  Your systolic blood pressure is above 130.  Your diastolic blood pressure is above 80.  Your personal target blood pressure may vary depending on your medical conditions, your age, and other factors. Follow these instructions at home: Eating and drinking  Eat a diet that is high in fiber and potassium, and low in sodium, added sugar, and fat. An example eating plan is called the DASH (Dietary Approaches to Stop Hypertension) diet. To eat this way: ? Eat plenty of fresh fruits and vegetables. Try to fill half of your plate at each meal with fruits and vegetables. ? Eat whole grains, such as whole wheat pasta, brown rice, or whole grain bread. Fill about one quarter of your plate with whole grains. ? Eat or drink low-fat dairy products, such as skim milk or low-fat yogurt. ? Avoid fatty cuts of meat, processed or cured meats, and poultry with skin. Fill about one quarter of your plate with lean proteins, such as fish, chicken without skin, beans, eggs, and tofu. ? Avoid premade and processed foods. These tend to be higher in sodium, added sugar, and fat.  Reduce your daily sodium intake. Most people with hypertension should eat less than 1,500 mg of sodium a day.  Limit alcohol intake to no more than 1 drink a day for nonpregnant women and 2 drinks a day for men. One drink equals 12 oz of beer, 5 oz of wine, or 1 oz of hard liquor. Lifestyle  Work with your health care provider to maintain a healthy body weight or to lose weight. Ask what an ideal weight is for you.  Get at least 30 minutes of exercise that causes your heart to beat faster (aerobic exercise) most days of the week. Activities may include walking, swimming, or biking.  Include exercise to strengthen your muscles (resistance exercise), such as pilates or lifting weights, as part of your weekly exercise routine. Try to do these types  of exercises for 30 minutes at least 3 days a week.  Do not use any products that contain nicotine or tobacco, such as cigarettes and e-cigarettes. If you need help quitting, ask your health care provider.  Monitor your blood pressure at home as told by your health care provider.  Keep all follow-up visits as told by your health care provider. This is important. Medicines  Take over-the-counter and prescription medicines only as told by your health care provider. Follow directions carefully. Blood pressure medicines must be taken as prescribed.  Do not skip doses of blood pressure medicine. Doing this puts you at risk for problems and can make the medicine less effective.  Ask your health care provider about side effects or reactions to medicines that you should watch for. Contact a health care provider if:  You think you are having a reaction to a medicine you are taking.  You have headaches that keep coming back (recurring).  You feel dizzy.  You have swelling in your ankles.  You have trouble with your vision. Get help right away if:    You develop a severe headache or confusion.  You have unusual weakness or numbness.  You feel faint.  You have severe pain in your chest or abdomen.  You vomit repeatedly.  You have trouble breathing. Summary  Hypertension is when the force of blood pumping through your arteries is too strong. If this condition is not controlled, it may put you at risk for serious complications.  Your personal target blood pressure may vary depending on your medical conditions, your age, and other factors. For most people, a normal blood pressure is less than 120/80.  Hypertension is treated with lifestyle changes, medicines, or a combination of both. Lifestyle changes include weight loss, eating a healthy, low-sodium diet, exercising more, and limiting alcohol. This information is not intended to replace advice given to you by your health care provider.  Make sure you discuss any questions you have with your health care provider. Document Released: 08/05/2005 Document Revised: 07/03/2016 Document Reviewed: 07/03/2016 [employer] Interactive Patient Education  2018 [employer]    How to Take Your Blood Pressure   Blood pressure is a measurement of how strongly your blood is pressing against the walls of your arteries. Arteries are blood vessels that carry blood from your heart throughout your body. Your health care provider takes your blood pressure at each office visit. You can also take your own blood pressure at home with a blood pressure machine. You may need to take your own blood pressure:  To confirm a diagnosis of high blood pressure (hypertension).  To monitor your blood pressure over time.  To make sure your blood pressure medicine is working.  Supplies needed: To take your blood pressure, you will need a blood pressure machine. You can buy a blood pressure machine, or blood pressure monitor, at most drugstores or online. There are several types of home blood pressure monitors. When choosing one, consider the following:  Choose a monitor that has an arm cuff.  Choose a monitor that wraps snugly around your upper arm. You should be able to fit only one finger between your arm and the cuff.  Do not choose a monitor that measures your blood pressure from your wrist or finger.  Your health care provider can suggest a reliable monitor that will meet your needs. How to prepare To get the most accurate reading, avoid the following for 30 minutes before you check your blood pressure:  Drinking caffeine.  Drinking alcohol.  Eating.  Smoking.  Exercising.  Five minutes before you check your blood pressure:  Empty your bladder.  Sit quietly without talking in a dining chair, rather than in a soft couch or armchair.  How to take your blood pressure To check your blood pressure, follow the instructions in the manual that  came with your blood pressure monitor. If you have a digital blood pressure monitor, the instructions may be as follows: 1. Sit up straight. 2. Place your feet on the floor. Do not cross your ankles or legs. 3. Rest your left arm at the level of your heart on a table or desk or on the arm of a chair. 4. Pull up your shirt sleeve. 5. Wrap the blood pressure cuff around the upper part of your left arm, 1 inch (2.5 cm) above your elbow. It is best to wrap the cuff around bare skin. 6. Fit the cuff snugly around your arm. You should be able to place only one finger between the cuff and your arm. 7. Position the cord inside the   groove of your elbow. 8. Press the power button. 9. Sit quietly while the cuff inflates and deflates. 10. Read the digital reading on the monitor screen and write it down (record it). 11. Wait 2-3 minutes, then repeat the steps, starting at step 1.  What does my blood pressure reading mean? A blood pressure reading consists of a higher number over a lower number. Ideally, your blood pressure should be below 120/80. The first ("top") number is called the systolic pressure. It is a measure of the pressure in your arteries as your heart beats. The second ("bottom") number is called the diastolic pressure. It is a measure of the pressure in your arteries as the heart relaxes. Blood pressure is classified into four stages. The following are the stages for adults who do not have a short-term serious illness or a chronic condition. Systolic pressure and diastolic pressure are measured in a unit called mm Hg. Normal  Systolic pressure: below 120.  Diastolic pressure: below 80. Elevated  Systolic pressure: 120-129.  Diastolic pressure: below 80. Hypertension stage 1  Systolic pressure: 130-139.  Diastolic pressure: 80-89. Hypertension stage 2  Systolic pressure: 140 or above.  Diastolic pressure: 90 or above. You can have prehypertension or hypertension even if only the  systolic or only the diastolic number in your reading is higher than normal. Follow these instructions at home:  Check your blood pressure as often as recommended by your health care provider.  Take your monitor to the next appointment with your health care provider to make sure: ? That you are using it correctly. ? That it provides accurate readings.  Be sure you understand what your goal blood pressure numbers are.  Tell your health care provider if you are having any side effects from blood pressure medicine. Contact a health care provider if:  Your blood pressure is consistently high. Get help right away if:  Your systolic blood pressure is higher than 180.  Your diastolic blood pressure is higher than 110. This information is not intended to replace advice given to you by your health care provider. Make sure you discuss any questions you have with your health care provider. Document Released: 01/12/2016 Document Revised: 03/26/2016 Document Reviewed: 01/12/2016 [employer] Interactive Patient Education  2018 [employer]    

## 2020-03-23 NOTE — Progress Notes (Signed)
Office: [phone]  /  Fax: [phone]    Date: April 06, 2020   Appointment Start Time: 11:05am Duration: 33 minutes Provider: Lawerance Cruel, Psy.D. Type of Session: Intake for Individual Therapy  Location of Patient: Home Location of Provider: Provider's Home Type of Contact: Telepsychological Visit via MyChart Video Visit  Informed Consent: This provider called Maisa at 11:02am as she did not present for the telepsychological appointment. She noted challenges connecting. Assistance was provided. As such, today's appointment was initiated 5 minutes late. Prior to proceeding with today's appointment, two pieces of identifying information were obtained. In addition, October's physical location at the time of this appointment was obtained as well a phone number she could be reached at in the event of technical difficulties. Alyanah and this provider participated in today's telepsychological service.   The provider's role was explained to [employer]. The provider reviewed and discussed issues of confidentiality, privacy, and limits therein (e.g., reporting obligations). In addition to verbal informed consent, written informed consent for psychological services was obtained prior to the initial appointment. Since the clinic is not a 24/7 crisis center, mental health emergency resources were shared and this  provider explained MyChart, e-mail, voicemail, and/or other messaging systems should be utilized only for non-emergency reasons. This provider also explained that information obtained during appointments will be placed in Deziyah's medical record and relevant information will be shared with other providers at [facility] for coordination of care. Moreover, Amandalee agreed information may be shared with other [facility] providers as needed for coordination of care. By signing the service agreement document, Kyanne provided written consent for coordination of care.  Prior to initiating telepsychological services, Jalacia completed an informed consent document, which included the development of a safety plan (i.e., an emergency contact, nearest emergency room, and emergency resources) in the event of an emergency/crisis. Alfred expressed understanding of the rationale of the safety plan. Eldoris verbally acknowledged understanding she is ultimately responsible for understanding her insurance benefits for telepsychological and in-person services. This provider also reviewed confidentiality, as it relates to telepsychological services, as well as the rationale for telepsychological services (i.e., to reduce exposure risk to COVID-19). Joya  acknowledged understanding that appointments cannot be recorded without both party consent and she is aware she is responsible for securing confidentiality on her end of the session. Kharlie verbally consented to proceed.  Chief Complaint/HPI: Marcianne was referred by Dr. Thomasene Lot due to other depression, with emotional eating. Per the note for the initial visit with Dr. Thomasene Lot on March 21, 2020, "Ivalee is struggling with emotional eating and using food for comfort to the extent that it is negatively impacting her health. She has been working on behavior modification techniques to help reduce her emotional eating and has been unsuccessful. She shows no sign of suicidal or homicidal ideations." The note for the initial appointment with Dr. Thomasene Lot  indicated the following: "Vista's habits were reviewed today and are as follows: Her family eats meals together, she thinks her family will eat healthier with her, she struggles with family and or coworkers weight loss sabotage, her desired weight loss is 163 pounds, she has been heavy most of her life, she started gaining weight after she had her tonsils removed, her heaviest weight ever was 333 pounds, she craves pasta, soda, chips, and burgers, she skips breakfast  frequently, she is frequently drinking liquids with calories, she frequently makes poor food choices, she has problems with excessive hunger, she frequently  eats larger portions than normal and she struggles with emotional eating." Elya's Food and Mood (modified PHQ-9) score on March 21, 2020 was 13.  During today's appointment, Britteney was verbally administered a questionnaire assessing various behaviors related to emotional eating. Shiron endorsed the following: overeat when you are celebrating, experience food cravings on a regular basis, eat certain foods when you are anxious, stressed, depressed, or your feelings are hurt, use food to help you cope with emotional situations, find food is comforting to you, overeat frequently when you are bored or lonely and eat as a reward. Priscila believes the onset of emotional eating was likely in childhood and described the current frequency of emotional eating as "few times a week." In addition, Sharronda denied a history of binge eating. Corayma denied a history of restricting food intake, purging and engagement in other compensatory strategies, and has never been diagnosed with an eating disorder. She also denied a history of treatment for emotional eating. Currently, Shambria indicated thinking about mother (mom passed away three years ago) and challenges with her love life triggers emotional eating. She stated she is unsure what makes emotional eating better. Furthermore, Diany denied other problems of concern.    Mental Status Examination:  Appearance: well groomed and appropriate hygiene  Behavior: appropriate to circumstances Mood: sad Affect: mood congruent; tearful  Speech: normal in rate, volume, and tone Eye Contact: appropriate Psychomotor Activity: appropriate Gait: unable to assess Thought Process: linear, logical, and goal directed  Thought Content/Perception: denies suicidal and homicidal ideation, plan, and intent and no hallucinations, delusions,  bizarre thinking or behavior reported or observed Orientation: time, person, place, and purpose of appointment Memory/Concentration: memory, attention, language, and fund of knowledge intact  Insight/Judgment: good  Family & Psychosocial History: Gayle reported she is not in a relationship and she does not have any children. 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 Additionally, Chasitee shared her highest level of education obtained is a bachelor's degree. Currently, Laysha's social support system consists of her sister, father, nieces, and nephew. Moreover, Ruth stated she resides with her father and sister.   Medical History:  Past Medical History:  Diagnosis Date  . Anxiety   . Dizziness   . Headache   . Headache(784.0)   . High blood pressure   . Joint pain   . Lactose intolerance   . Left arm pain   . Low back pain headach  . Lower extremity edema   . SOB (shortness of breath)   . UTI (lower urinary tract infection)    Past Surgical History:  Procedure Laterality Date  . TONSILLECTOMY    . WISDOM TOOTH EXTRACTION     Current Outpatient Medications on File Prior to Visit  Medication Sig Dispense Refill  . acetaminophen (TYLENOL) 500 MG tablet Take 1,000 mg by mouth every 6 (six) hours as needed. For pain    . albuterol (VENTOLIN HFA) 108 (90 Base) MCG/ACT inhaler Inhale 1-2 puffs into the lungs every 6 (six) hours as needed for wheezing or shortness of breath. 6.7 g 0  . Aspirin-Acetaminophen-Caffeine (GOODY HEADACHE PO) Take 1 packet by mouth daily as needed (pain).    . Vitamin D, Ergocalciferol, (DRISDOL) 1.25 MG (50000 UNIT) CAPS capsule Take 1 capsule (50,000 Units total) by mouth every 7 (seven) days. 4 capsule 0   No current facility-administered medications on file prior to visit.   Mental  Health History: Aleigha denied a history of therapeutic and psychiatric  services. Haileigh reported there is no history of hospitalizations for psychiatric concerns. Jenina denied a family history of mental health related concerns. Kaelyn reported there is no history of trauma including psychological, physical  and sexual abuse, as well as neglect.   Milo described her typical mood lately as "overwhelmed" and "stressed," noting she has "a lot" that she needs to do. She explained she spends a lot of time at work; therefore, does not have time for self-care. Aside from concerns noted above and endorsed on the PHQ-9 and GAD-7, Linde reported experiencing crying spells; decreased motivation; and worry thoughts about her father's well-being and her future plans. She also noted a history of panic attacks, noting the last time was three years ago after her mother passed away. Caroleena denied current alcohol use. She denied tobacco use. She denied illicit/recreational substance use. Regarding caffeine intake, Alycia reported consuming a large latte once a week. Furthermore, Vernice indicated she is not experiencing the following: hallucinations and delusions, paranoia, symptoms of mania , social withdrawal and panic attacks. She also denied history of and current suicidal ideation, plan, and intent; history of and current homicidal ideation, plan, and intent; and history of and current engagement in self-harm. Notably, Kymberlyn endorsed item 9 (i.e., "Do you feel that your weight problem is so hopeless that sometimes life doesn't seem worth living?") on the modified PHQ-9 during her initial appointment with Dr. Thomasene Lot on March 21, 2020. Lella reported endorsed the item due to feeling stuck about weight and not due to suicidal ideation.   The following strengths were reported by Cambrie: compassionate, very giving, kind, and empathetic. The following strengths were observed by this provider: ability to express thoughts and feelings during the therapeutic session, ability to establish  and benefit from a therapeutic relationship, willingness to work toward established goal(s) with the clinic and ability to engage in reciprocal conversation.   Legal History: Shareeka reported there is no history of legal involvement.   Structured Assessments Results: The Patient Health Questionnaire-9 (PHQ-9) is a self-report measure that assesses symptoms and severity of depression over the course of the last two weeks. Dina obtained a score of 5 suggesting mild depression. Maribell finds the endorsed symptoms to be very difficult. [0= Not at all; 1= Several days; 2= More than half the days; 3= Nearly every day] Little interest or pleasure in doing things 0  Feeling down, depressed, or hopeless 0  Trouble falling or staying asleep, or sleeping too much 1  Feeling tired or having little energy 2  Poor appetite or overeating 0  Feeling bad about yourself --- or that you are a failure or have let yourself or your family down 1  Trouble concentrating on things, such as reading the newspaper or watching television 1  Moving or speaking so slowly that other people could have noticed? Or the opposite --- being so fidgety or restless that you have been moving around a lot more than usual 0  Thoughts that you would be better off dead or hurting yourself in some way 0  PHQ-9 Score 5    The Generalized Anxiety Disorder-7 (GAD-7) is a brief self-report measure that assesses symptoms of anxiety over the course of the last two weeks. Chaka obtained a score of 5 suggesting mild anxiety. Gidget finds the endorsed symptoms to be very difficult. [0= Not at all; 1= Several days; 2= Over half the days; 3= Nearly every day] Feeling  nervous, anxious, on edge 0  Not being able to stop or control worrying 1  Worrying too much about different things 0  Trouble relaxing 1  Being so restless that it's hard to sit still 0  Becoming easily annoyed or irritable 2  Feeling afraid as if something awful might happen 1    GAD-7 Score 5   Interventions:  Conducted a chart review Focused on rapport building Verbally administered PHQ-9 and GAD-7 for symptom monitoring Verbally administered Food & Mood questionnaire to assess various behaviors related to emotional eating Provided emphatic reflections and validation Collaborated with patient on a treatment goal  Psychoeducation provided regarding physical versus emotional hunger  Recommended longer-term therapeutic services  Provisional DSM-5 Diagnosis(es): 311 (F32.8) Other Specified Depressive Disorder, Emotional Eating Behaviors and 300.00 (F41.9) Unspecified Anxiety Disorder  Plan: [name] appears able and willing to participate as evidenced by collaboration on a treatment goal, engagement in reciprocal conversation, and asking questions as needed for clarification. Quaniya expressed desire to think about longer-term therapeutic services, but was receptive to meeting with this provider to address eating concerns. As such, the next appointment will be scheduled in 2-3 weeks, which will be via MyChart Video Visit. The following treatment goal was established: increase coping skills. This provider will regularly review the treatment plan and medical chart to keep informed of status changes. Dawne expressed understanding and agreement with the initial treatment plan of care. Maghan will be sent a handout via e-mail to utilize between now and the next appointment to increase awareness of hunger patterns and subsequent eating. Merilee provided verbal consent during today's appointment for this provider to send the handout via e-mail.

## 2020-03-24 LAB — CBC WITH DIFFERENTIAL/PLATELET
Basophils Absolute: 0.1 10*3/uL (ref 0.0–0.2)
Basos: 1 %
EOS (ABSOLUTE): 0.3 10*3/uL (ref 0.0–0.4)
Eos: 3 %
Hematocrit: 37.7 % (ref 34.0–46.6)
Hemoglobin: 12.5 g/dL (ref 11.1–15.9)
Immature Grans (Abs): 0 10*3/uL (ref 0.0–0.1)
Immature Granulocytes: 0 %
Lymphocytes Absolute: 4 10*3/uL — ABNORMAL HIGH (ref 0.7–3.1)
Lymphs: 41 %
MCH: 26.2 pg — ABNORMAL LOW (ref 26.6–33.0)
MCHC: 33.2 g/dL (ref 31.5–35.7)
MCV: 79 fL (ref 79–97)
Monocytes Absolute: 0.7 10*3/uL (ref 0.1–0.9)
Monocytes: 7 %
Neutrophils Absolute: 4.8 10*3/uL (ref 1.4–7.0)
Neutrophils: 48 %
Platelets: 247 10*3/uL (ref 150–450)
RBC: 4.78 x10E6/uL (ref 3.77–5.28)
RDW: 13.6 % (ref 11.7–15.4)
WBC: 9.9 10*3/uL (ref 3.4–10.8)

## 2020-03-24 LAB — LIPID PANEL WITH LDL/HDL RATIO
Cholesterol, Total: 175 mg/dL (ref 100–199)
HDL: 61 mg/dL (ref >39)
LDL Chol Calc (NIH): 105 mg/dL — ABNORMAL HIGH (ref 0–99)
LDL/HDL Ratio: 1.7 ratio (ref 0.0–3.2)
Triglycerides: 41 mg/dL (ref 0–149)
VLDL Cholesterol Cal: 9 mg/dL (ref 5–40)

## 2020-03-24 LAB — T4: T4, Total: 9.3 ug/dL (ref 4.5–12.0)

## 2020-03-24 LAB — COMPREHENSIVE METABOLIC PANEL
ALT: 16 IU/L (ref 0–32)
AST: 17 IU/L (ref 0–40)
Albumin/Globulin Ratio: 1.7 (ref 1.2–2.2)
Albumin: 4.6 g/dL (ref 3.8–4.8)
Alkaline Phosphatase: 65 IU/L (ref 48–121)
BUN/Creatinine Ratio: 13 (ref 9–23)
BUN: 10 mg/dL (ref 6–24)
Bilirubin Total: 0.3 mg/dL (ref 0.0–1.2)
CO2: 23 mmol/L (ref 20–29)
Calcium: 9.5 mg/dL (ref 8.7–10.2)
Chloride: 100 mmol/L (ref 96–106)
Creatinine, Ser: 0.78 mg/dL (ref 0.57–1.00)
GFR calc Af Amer: 106 mL/min/{1.73_m2} (ref >59)
GFR calc non Af Amer: 92 mL/min/{1.73_m2} (ref >59)
Globulin, Total: 2.7 g/dL (ref 1.5–4.5)
Glucose: 87 mg/dL (ref 65–99)
Potassium: 4.2 mmol/L (ref 3.5–5.2)
Sodium: 138 mmol/L (ref 134–144)
Total Protein: 7.3 g/dL (ref 6.0–8.5)

## 2020-03-24 LAB — INSULIN, RANDOM: INSULIN: 6.6 u[IU]/mL (ref 2.6–24.9)

## 2020-03-24 LAB — TSH: TSH: 1.87 u[IU]/mL (ref 0.450–4.500)

## 2020-03-24 LAB — HEMOGLOBIN A1C
Est. average glucose Bld gHb Est-mCnc: 120 mg/dL
Hgb A1c MFr Bld: 5.8 % — ABNORMAL HIGH (ref 4.8–5.6)

## 2020-03-24 LAB — T3: T3, Total: 103 ng/dL (ref 71–180)

## 2020-03-24 LAB — FOLATE: Folate: 10.8 ng/mL (ref >3.0)

## 2020-03-24 LAB — VITAMIN B12: Vitamin B-12: 419 pg/mL (ref 232–1245)

## 2020-03-24 LAB — VITAMIN D 25 HYDROXY (VIT D DEFICIENCY, FRACTURES): Vit D, 25-Hydroxy: 17.1 ng/mL — ABNORMAL LOW (ref 30.0–100.0)

## 2020-04-04 ENCOUNTER — Ambulatory Visit (INDEPENDENT_AMBULATORY_CARE_PROVIDER_SITE_OTHER): Payer: 59 | Admitting: Family Medicine

## 2020-04-04 ENCOUNTER — Other Ambulatory Visit: Payer: Self-pay

## 2020-04-04 VITALS — BP 140/79 | HR 89 | Temp 97.9°F | Ht 64.0 in | Wt 336.0 lb

## 2020-04-04 DIAGNOSIS — I10 Essential (primary) hypertension: Secondary | ICD-10-CM

## 2020-04-04 DIAGNOSIS — R519 Headache, unspecified: Secondary | ICD-10-CM | POA: Diagnosis not present

## 2020-04-04 DIAGNOSIS — R7303 Prediabetes: Secondary | ICD-10-CM | POA: Diagnosis not present

## 2020-04-04 DIAGNOSIS — E7849 Other hyperlipidemia: Secondary | ICD-10-CM | POA: Diagnosis not present

## 2020-04-04 DIAGNOSIS — G8929 Other chronic pain: Secondary | ICD-10-CM

## 2020-04-04 DIAGNOSIS — F3289 Other specified depressive episodes: Secondary | ICD-10-CM

## 2020-04-04 DIAGNOSIS — Z6841 Body Mass Index (BMI) 40.0 and over, adult: Secondary | ICD-10-CM

## 2020-04-04 DIAGNOSIS — Z9189 Other specified personal risk factors, not elsewhere classified: Secondary | ICD-10-CM | POA: Diagnosis not present

## 2020-04-04 DIAGNOSIS — E559 Vitamin D deficiency, unspecified: Secondary | ICD-10-CM | POA: Diagnosis not present

## 2020-04-04 MED ORDER — VITAMIN D (ERGOCALCIFEROL) 1.25 MG (50000 UNIT) PO CAPS: 50000.0000 [IU] | ORAL_CAPSULE | ORAL | 0 refills | Status: DC

## 2020-04-04 NOTE — Patient Instructions (Signed)
The 10-year ASCVD risk score Denman George DC Montez Hageman., et al., 2013) is: 1.3%   Values used to calculate the score:     Age: 44 years     Sex: Female     Is Non-Hispanic African American: Yes     Diabetic: No     Tobacco smoker: No     Systolic Blood Pressure: 140 mmHg     Is BP treated: No     HDL Cholesterol: 61 mg/dL     Total Cholesterol: 175 mg/dL

## 2020-04-05 ENCOUNTER — Encounter (INDEPENDENT_AMBULATORY_CARE_PROVIDER_SITE_OTHER): Payer: Self-pay | Admitting: Family Medicine

## 2020-04-05 NOTE — [facility] Notes (Signed)
Chief Complaint:   OBESITY Stacy Thompson is here to discuss her progress with her obesity treatment plan along with follow-up of her obesity related diagnoses. Stacy Thompson is on the Category 2 Plan and states she is following her eating plan approximately 80% of the time. Stacy Thompson states she is swimming for 60 minutes 4 times per week.  Today's visit was #: 2 Starting weight: 336 lbs Starting date: 03/21/2020 Today's weight: 336 lbs Today's date: 04/04/2020 Total lbs lost to date: 0 Total lbs lost since last in-office visit: 0  Interim History: This is Stacy Thompson's first follow-up visit since starting the program.  She has a big birthday bash at work and with family/friends.  For several days, she followed the plan, and during those times, it was difficult at dinner to figure out what to eat.  She found that she did not plan as well as she could have.  Subjective:   1. Essential hypertension Review: taking medications as instructed, no medication side effects noted, no chest pain on exertion, no dyspnea on exertion, no swelling of ankles.  Poorly controlled at last visit.  She was asked to check at home and bring in a log.  Blood pressure 147/70s.  Rarely 150s or more systolic.  Denies symptoms.  BP Readings from Last 3 Encounters:  04/04/20 140/79  03/21/20 (!) 152/78  07/12/19 (!) 150/90   2. Chronic nonintractable headache, unspecified headache type She was told to follow-up with her PCP at last office visit.  She has not had a chance to yet, but has not had a headache or any issues since she was last seen.  3. Other hyperlipidemia Stacy Thompson has hyperlipidemia and has been trying to improve her cholesterol levels with intensive lifestyle modification including a low saturated fat diet, exercise and weight loss. She denies any chest pain, claudication or myalgias.  Slight elevation in LDL, and HDL is greater than 60.  She has never been on medication before.  ASCVD risk is less than 2%.  Lab Results   Component Value Date   ALT 16 03/21/2020   AST 17 03/21/2020   ALKPHOS 65 03/21/2020   BILITOT 0.3 03/21/2020   Lab Results  Component Value Date   CHOL 175 03/21/2020   HDL 61 03/21/2020   LDLCALC 105 (H) 03/21/2020   TRIG 41 03/21/2020   4. Vitamin D deficiency Stacy Thompson's Vitamin D level was 17.1 on 03/21/2020. She is currently taking no vitamin D supplement.     5. Prediabetes Stacy Thompson has a diagnosis of prediabetes based on her elevated HgA1c and was informed this puts her at greater risk of developing diabetes. She continues to work on diet and exercise to decrease her risk of diabetes. She denies nausea or hypoglycemia.  A1c is 5.8 with an elevated fasting insulin.  She denies symptoms.  Lab Results  Component Value Date   HGBA1C 5.8 (H) 03/21/2020   Lab Results  Component Value Date   INSULIN 6.6 03/21/2020   6. Other depression with emotional eating Stacy Thompson was referred to Dr. Dewaine Conger.  She has a follow-up coming up in the near future.  Denies concerns or issues today.  She endorses emotional eating.  Denies SI/HI.  7. At risk for constipation Stacy Thompson is at increased risk for constipation due to barely any water intake as well as change in diet.  Assessment/Plan:   1. Essential hypertension Discussed labs with patient today.  Stacy Thompson is working on healthy weight loss and exercise to improve blood pressure  control. We will watch for signs of hypotension as she continues her lifestyle modifications.  Continue to monitor at home.  If it remains above 135/85 or so, she will need medication at next office visit.  Recommend she follow-up with her PCP in the near future.  Follow prudent nutritional plan, weight loss, low salt diet.  2. Chronic nonintractable headache, unspecified headache type Discussed labs with patient today.  Follow prudent nutritional plan, proper hydration, follow-up with PCP for routine treatment.  3. Other hyperlipidemia New.  Discussed labs with patient  today.  Cardiovascular risk and specific lipid/LDL goals reviewed.  We discussed several lifestyle modifications today and Briann will continue to work on diet, exercise and weight loss efforts. Orders and follow up as documented in patient record.  Follow prudent nutritional plan, weight loss, recheck labs in 3 months.  Counseling Intensive lifestyle modifications are the first line treatment for this issue. . Dietary changes: Increase soluble fiber. Decrease simple carbohydrates. . Exercise changes: Moderate to vigorous-intensity aerobic activity 150 minutes per week if tolerated. . Lipid-lowering medications: see documented in medical record.  4. Vitamin D deficiency New.  Discussed labs with patient today.  Low Vitamin D level contributes to fatigue and are associated with obesity, breast, and colon cancer. She agrees to start to take prescription Vitamin D @50 ,000 IU every week and will follow-up for routine testing of Vitamin D, at least 2-3 times per year to avoid over-replacement.  Recheck vitamin D level in 3 months.  - Start Vitamin D, Ergocalciferol, (DRISDOL) 1.25 MG (50000 UNIT) CAPS capsule; Take 1 capsule (50,000 Units total) by mouth every 7 (seven) days.  Dispense: 4 capsule; Refill: 0  5. Prediabetes New.  Discussed labs with patient today.  Stacy Thompson will continue to work on weight loss, exercise, and decreasing simple carbohydrates to help decrease the risk of diabetes.  Follow prudent nutritional plan, weight loss, recheck labs in 3 months.  6. Other depression with emotional eating Discussed labs with patient today.  Continue to follow-up with Dr. .  7. At risk for constipation Stacy Thompson was given approximately 30 minutes of counseling today regarding prevention of constipation, she barely drinks any water and also with change in diet puts her high risk. She was encouraged to increase water and fiber intake.   8. Class 3 severe obesity with serious comorbidity and body  mass index (BMI) of 50.0 to 59.9 in adult, unspecified obesity type (HCC) Stacy Thompson is currently in the action stage of change. As such, her goal is to continue with weight loss efforts. She has agreed to the Category 2 Plan.   Exercise goals: As is.  Behavioral modification strategies: increasing lean protein intake, decreasing simple carbohydrates, increasing vegetables, increasing water intake, no skipping meals, meal planning and cooking strategies and planning for success.  Stacy Thompson has agreed to follow-up with our clinic in 2 weeks. She was informed of the importance of frequent follow-up visits to maximize her success with intensive lifestyle modifications for her multiple health conditions.   Objective:   Blood pressure 140/79, pulse 89, temperature 97.9 F (36.6 C), height 5\' 4"  (1.626 m), weight (!) 336 lb (152.4 kg), SpO2 99 %, unknown if currently breastfeeding. Body mass index is 57.67 kg/m.  General: Cooperative, alert, well developed, in no acute distress. HEENT: Conjunctivae and lids unremarkable. Cardiovascular: Regular rhythm.  Lungs: Normal work of breathing. Neurologic: No focal deficits.   Lab Results  Component Value Date   CREATININE 0.78 03/21/2020   BUN 10 03/21/2020  NA 138 03/21/2020   K 4.2 03/21/2020   CL 100 03/21/2020   CO2 23 03/21/2020   Lab Results  Component Value Date   ALT 16 03/21/2020   AST 17 03/21/2020   ALKPHOS 65 03/21/2020   BILITOT 0.3 03/21/2020   Lab Results  Component Value Date   HGBA1C 5.8 (H) 03/21/2020   Lab Results  Component Value Date   INSULIN 6.6 03/21/2020   Lab Results  Component Value Date   TSH 1.870 03/21/2020   Lab Results  Component Value Date   CHOL 175 03/21/2020   HDL 61 03/21/2020   LDLCALC 105 (H) 03/21/2020   TRIG 41 03/21/2020   Lab Results  Component Value Date   WBC 9.9 03/21/2020   HGB 12.5 03/21/2020   HCT 37.7 03/21/2020   MCV 79 03/21/2020   PLT 247 03/21/2020   Attestation  Statements:   Reviewed by clinician on day of visit: allergies, medications, problem list, medical history, surgical history, family history, social history, and previous encounter notes.  I, Insurance claims handler, CMA, am acting as Energy manager for [employer], DO.  I have reviewed the above documentation for accuracy and completeness, and I agree with the above. Thomasene Lot, DO

## 2020-04-06 ENCOUNTER — Telehealth (INDEPENDENT_AMBULATORY_CARE_PROVIDER_SITE_OTHER): Payer: 59 | Admitting: Psychology

## 2020-04-06 ENCOUNTER — Other Ambulatory Visit: Payer: Self-pay

## 2020-04-06 DIAGNOSIS — F419 Anxiety disorder, unspecified: Secondary | ICD-10-CM | POA: Diagnosis not present

## 2020-04-06 DIAGNOSIS — F3289 Other specified depressive episodes: Secondary | ICD-10-CM

## 2020-04-13 NOTE — Progress Notes (Unsigned)
Office: [phone]  /  Fax: [phone]    Date: April 27, 2020   Appointment Start Time: *** Duration: *** minutes Provider: Lawerance Cruel, Psy.D. Type of Session: Individual Therapy  Location of Patient: {gbptloc:23249} Location of Provider: Provider's Home Type of Contact: Telepsychological Visit via MyChart Video Visit  Session Content: Stacy Thompson is a 44 y.o. female presenting for a follow-up appointment to address the previously established treatment goal of increasing coping skills. Today's appointment was a telepsychological visit due to COVID-19. Stacy Thompson provided verbal consent for today's telepsychological appointment and she is aware she is responsible for securing confidentiality on her end of the session. Prior to proceeding with today's appointment, Stacy Thompson's physical location at the time of this appointment was obtained as well a phone number she could be reached at in the event of technical difficulties. Stacy Thompson and this provider participated in today's telepsychological service.   This provider conducted a brief check-in and verbally administered the PHQ-9 and GAD-7. *** Stacy Thompson was receptive to today's appointment as evidenced by openness to sharing, responsiveness to feedback, and {gbreceptiveness:23401}.  Mental Status Examination:  Appearance: {Appearance:22431} Behavior: {Behavior:22445} Mood: {gbmood:21757} Affect: {Affect:22436} Speech: {Speech:22432} Eye Contact: {Eye Contact:22433} Psychomotor Activity: {Motor Activity:22434} Gait: {gbgait:23404} Thought Process: {thought process:22448}  Thought Content/Perception: {disturbances:22451} Orientation: {Orientation:22437} Memory/Concentration: {gbcognition:22449} Insight/Judgment: {Insight:22446}  Structured Assessments Results: The Patient Health Questionnaire-9 (PHQ-9) is a self-report measure that assesses symptoms and severity of depression over the course of the last two weeks. Stacy Thompson obtained a score of ***  suggesting {GBPHQ9SEVERITY:21752}. Stacy Thompson finds the endorsed symptoms to be {gbphq9difficulty:21754}. [0= Not at all; 1= Several days; 2= More than half the days; 3= Nearly every day] Little interest or pleasure in doing things ***  Feeling down, depressed, or hopeless ***  Trouble falling or staying asleep, or sleeping too much ***  Feeling tired or having little energy ***  Poor appetite or overeating ***  Feeling bad about yourself --- or that you are a failure or have let yourself or your family down ***  Trouble concentrating on things, such as reading the newspaper or watching television ***  Moving or speaking so slowly that other people could have noticed? Or the opposite --- being so fidgety or restless that you have been moving around a lot more than usual ***  Thoughts that you would be better off dead or hurting yourself in some way ***  PHQ-9 Score ***    The Generalized Anxiety Disorder-7 (GAD-7) is a brief self-report measure that assesses symptoms of anxiety over the course of the last two weeks. Stacy Thompson obtained a score of *** suggesting {gbgad7severity:21753}. Stacy Thompson finds the endorsed symptoms to be {gbphq9difficulty:21754}. [0= Not at all; 1= Several days; 2= Over half the days; 3= Nearly every day] Feeling nervous, anxious, on edge ***  Not being able to stop or control worrying ***  Worrying too much about different things ***  Trouble relaxing ***  Being so restless that it's hard to sit still ***  Becoming easily annoyed or irritable ***  Feeling afraid as if something awful might happen ***  GAD-7 Score ***   Interventions:  {Interventions for Progress Notes:23405}  DSM-5 Diagnosis(es): 311 (F32.8) Other Specified Depressive Disorder, Emotional Eating Behaviors and 300.00 (F41.9) Unspecified Anxiety Disorder  Treatment Goal & Progress: During the initial appointment with this provider, the following treatment goal was established: increase coping skills. Stacy Thompson  has demonstrated progress in her goal as evidenced by {gbtxprogress:22839}. [name] also {gbtxprogress2:22951}.  Plan: The next appointment will be  scheduled in {gbweeks:21758}, which will be {gbtxmodality:23402}. The next session will focus on {Plan for Next Appointment:23400}.

## 2020-04-19 ENCOUNTER — Ambulatory Visit (INDEPENDENT_AMBULATORY_CARE_PROVIDER_SITE_OTHER): Payer: 59 | Admitting: Family Medicine

## 2020-04-19 ENCOUNTER — Other Ambulatory Visit: Payer: Self-pay

## 2020-04-19 ENCOUNTER — Encounter (INDEPENDENT_AMBULATORY_CARE_PROVIDER_SITE_OTHER): Payer: Self-pay | Admitting: Family Medicine

## 2020-04-19 VITALS — BP 131/82 | HR 97 | Temp 98.3°F | Ht 64.0 in | Wt 338.0 lb

## 2020-04-19 DIAGNOSIS — F3289 Other specified depressive episodes: Secondary | ICD-10-CM

## 2020-04-19 DIAGNOSIS — Z6841 Body Mass Index (BMI) 40.0 and over, adult: Secondary | ICD-10-CM

## 2020-04-19 DIAGNOSIS — Z9189 Other specified personal risk factors, not elsewhere classified: Secondary | ICD-10-CM | POA: Diagnosis not present

## 2020-04-19 DIAGNOSIS — E559 Vitamin D deficiency, unspecified: Secondary | ICD-10-CM | POA: Diagnosis not present

## 2020-04-19 DIAGNOSIS — R7303 Prediabetes: Secondary | ICD-10-CM

## 2020-04-19 MED ORDER — BUPROPION HCL ER (SR) 100 MG PO TB12: 100.0000 mg | ORAL_TABLET | Freq: Two times a day (BID) | ORAL | 0 refills | Status: DC

## 2020-04-19 MED ORDER — VITAMIN D (ERGOCALCIFEROL) 1.25 MG (50000 UNIT) PO CAPS: 50000.0000 [IU] | ORAL_CAPSULE | ORAL | 0 refills | Status: DC

## 2020-04-20 NOTE — Progress Notes (Signed)
Chief Complaint:   OBESITY Stacy Thompson is here to discuss her progress with her obesity treatment plan along with follow-up of her obesity related diagnoses. Stacy Thompson is on the Category 2 Plan and states she is following her eating plan approximately 80% of the time. Stacy Thompson states she is exercising for 0 minutes 0 times per week.  Today's visit was #: 3 Starting weight: 336 lbs Starting date: 03/21/2020 Today's weight: 338 lbs Today's date: 04/19/2020 Total lbs lost to date: 0 Total lbs lost since last in-office visit: 0  Interim History: Stacy Thompson recently ended a long term relationship last week.  She was with him for 5 years.  She told him they are better off as friends. Even though she knows this is what is best for her, she is still hurting b/c of it and change is difficult.   She is tearful in office today.   She has found it more challenging to stick with the plan and drank 7 sodas in the past 2 weeks.  She is happy she changed her PCP to Dr. Delia Chimes recently.  Subjective:   1. Vitamin D deficiency Stacy Thompson's Vitamin D level was 17.1 on 03/21/2020. She is currently taking prescription vitamin D 50,000 IU each week. She denies nausea, vomiting or muscle weakness.  She started her prescription after last office visit.  2. Prediabetes New onset at last visit.  Stacy Thompson has a diagnosis of prediabetes based on her elevated HgA1c and was informed this puts her at greater risk of developing diabetes. She continues to work on diet and exercise to decrease her risk of diabetes. She denies nausea or hypoglycemia.  She has no questions or concerns.  Lab Results  Component Value Date   HGBA1C 5.8 (H) 03/21/2020   Lab Results  Component Value Date   INSULIN 6.6 03/21/2020   3. Other Specified Depressive Disorder, Emotional Eating Behaviors  Stacy Thompson says she is under a lot of stress from ending a 5 year relationship last week.  Denies SI.  She is not sure she needs medications.  She met with Dr.  Mallie Mussel on 04/06/2020, and will again in the near future.  In the past, she was on some medications after her mother passed (Xanax).  4. At risk for diabetes mellitus Stacy Thompson is at higher than average risk for developing diabetes due to her obesity.   Assessment/Plan:   1. Vitamin D deficiency Low Vitamin D level contributes to fatigue and are associated with obesity, breast, and colon cancer. She agrees to continue to take prescription Vitamin D _0 ,000 IU every week and will follow-up for routine testing of Vitamin D, at least 2-3 times per year to avoid over-replacement.  -Refill Vitamin D, Ergocalciferol, (DRISDOL) 1.25 MG (50000 UNIT) CAPS capsule; Take 1 capsule (50,000 Units total) by mouth every 7 (seven) days.  Dispense: 4 capsule; Refill: 0  2. Prediabetes Stacy Thompson will continue to work on weight loss, exercise, and decreasing simple carbohydrates to help decrease the risk of diabetes.   3. Other Specified Depressive Disorder, Emotional Eating Behaviors  - Mood is not well controlled currently   - Counseled patient on pathophysiology of disease and discussed various treatment options, which often includes dietary and lifestyle modifications as first line, in addition to discussing the risks and benefits of various medications.   - Told patient to walk at least 5-10 minutes per day  -Explained to patient we need to infuse their brain with more positive thoughts, getting them to become more  hopeful and feel more in control.  - In addition to possible prescription interventions, reviewed the "spokes of the wheel" of mood and health management.    Stressed the importance of ongoing prudent health habits, including regular exercise, appropriate sleep hygiene, healthy dietary habits, prayer/ meditation to help with mood stability and seeking the help of a professional counselor discussed.    - Anticipatory guidance given and extensive counseling and education provided to patient today.   All questions answered.  - Encouraged patient to work on simplifying life and minimizing sources of stress.  Encouraged possible job change as well since is at the core of immense stress for pt.  Start Wellbutrin, as per below.  Continue counseling, prudent nutritional plan, weight loss, meal planning, walking daily for stress.  Close follow-up.  -Start buPROPion (WELLBUTRIN SR) 100 MG 12 hr tablet; Take 1 tablet (100 mg total) by mouth 2 (two) times daily.  Dispense: 60 tablet; Refill: 0  4. At risk for diabetes mellitus Stacy Thompson was given approximately 10 minutes of diabetes education and counseling today. We discussed intensive lifestyle modifications today with an emphasis on weight loss as well as increasing exercise and decreasing simple carbohydrates in her diet. We also reviewed medication options with an emphasis on risk versus benefit of those discussed.   5. Class 3 severe obesity with serious comorbidity and body mass index (BMI) of 50.0 to 59.9 in adult, unspecified obesity type ([place]) Stacy Thompson is currently in the action stage of change. As such, her goal is to continue with weight loss efforts. She has agreed to the Category 2 Plan.   Exercise goals: As is.  Behavioral modification strategies: increasing lean protein intake, decreasing simple carbohydrates, meal planning and cooking strategies, emotional eating strategies and planning for success.  Stacy Thompson has agreed to follow-up with our clinic in 2 weeks. She was informed of the importance of frequent follow-up visits to maximize her success with intensive lifestyle modifications for her multiple health conditions.   Objective:   Blood pressure 131/82, pulse 97, temperature 98.3 F (36.8 C), height 5' 4" (1.626 m), weight (!) 338 lb (153.3 kg), SpO2 98 %, unknown if currently breastfeeding. Body mass index is 58.02 kg/m.  General: Cooperative, alert, well developed, in no acute distress. HEENT: Conjunctivae and lids  unremarkable. Cardiovascular: Regular rhythm.  Lungs: Normal work of breathing. Neurologic: No focal deficits.   Lab Results  Component Value Date   CREATININE 0.78 03/21/2020   BUN 10 03/21/2020   NA 138 03/21/2020   K 4.2 03/21/2020   CL 100 03/21/2020   CO2 23 03/21/2020   Lab Results  Component Value Date   ALT 16 03/21/2020   AST 17 03/21/2020   ALKPHOS 65 03/21/2020   BILITOT 0.3 03/21/2020   Lab Results  Component Value Date   HGBA1C 5.8 (H) 03/21/2020   Lab Results  Component Value Date   INSULIN 6.6 03/21/2020   Lab Results  Component Value Date   TSH 1.870 03/21/2020   Lab Results  Component Value Date   CHOL 175 03/21/2020   HDL 61 03/21/2020   LDLCALC 105 (H) 03/21/2020   TRIG 41 03/21/2020   Lab Results  Component Value Date   WBC 9.9 03/21/2020   HGB 12.5 03/21/2020   HCT 37.7 03/21/2020   MCV 79 03/21/2020   PLT 247 03/21/2020   Attestation Statements:   Reviewed by clinician on day of visit: allergies, medications, problem list, medical history, surgical history, family history, social history,  and previous encounter notes.  I, Water quality scientist, CMA, am acting as Location manager for [employer], DO.  I have reviewed the above documentation for accuracy and completeness, and I agree with the above. Mellody Dance, DO

## 2020-04-25 NOTE — Progress Notes (Signed)
  Office: [phone]  /  Fax: [phone]    Date: May 04, 2020   Appointment Start Time: 11:05am Duration: 24 minutes Provider: Lawerance Cruel, Psy.D. Type of Session: Individual Therapy  Location of Patient: Work Location of Provider: Provider's Home Type of Contact: Telepsychological Visit via MyChart Video Visit  Session Content: This provider called Stacy Thompson at 11:02am as she did not present for the telepsychological appointment. She indicated she forgot about today's appointment, but noted she could join. As such, today's appointment was initiated 5 minutes late. Stacy Thompson is a 44 y.o. female presenting for a follow-up appointment to address the previously established treatment goal of increasing coping skills. Today's appointment was a telepsychological visit due to COVID-19. Stacy Thompson provided verbal consent for today's telepsychological appointment and she is aware she is responsible for securing confidentiality on her end of the session. Prior to proceeding with today's appointment, Stacy Thompson's physical location at the time of this appointment was obtained as well a phone number she could be reached at in the event of technical difficulties. Stacy Thompson and this provider participated in today's telepsychological service. Of note, today's appointment was switched to a regular telephone call at 11:07am with Stacy Thompson's verbal consent due to technical issues.   This provider conducted a brief check-in. Stacy Thompson shared she was recently prescribed Wellbutrin by Dr. Sharee Holster, noting, "I noticed it's been helping me." Reviewed emotional and physical hunger. Psychoeducation regarding triggers for emotional eating was provided. Stacy Thompson was provided a handout, and encouraged to utilize the handout between now and the next appointment to increase awareness of triggers and frequency. Stacy Thompson agreed. This provider also discussed behavioral strategies for specific triggers, such as placing the utensil down when  conversing to avoid mindless eating. Stacy Thompson provided verbal consent during today's appointment for this provider to send a handout about triggers via e-mail. Stacy Thompson was receptive to today's appointment as evidenced by openness to sharing, responsiveness to feedback, and willingness to explore triggers for emotional eating.  Mental Status Examination:  Appearance: well groomed and appropriate hygiene  Behavior: appropriate to circumstances Mood: euthymic Affect: mood congruent Speech: normal in rate, volume, and tone Eye Contact: appropriate Psychomotor Activity: appropriate Gait: unable to assess Thought Process: linear, logical, and goal directed  Thought Content/Perception: no hallucinations, delusions, bizarre thinking or behavior reported or observed and denies suicidal and homicidal ideation, plan, and intent, as well as self-harm ideation Orientation: time, person, place, and purpose of appointment Memory/Concentration: memory, attention, language, and fund of knowledge intact  Insight/Judgment: fair  Interventions:  Conducted a brief chart review Provided empathic reflections and validation Reviewed content from the previous session Employed supportive psychotherapy interventions to facilitate reduced distress and to improve coping skills with identified stressors Psychoeducation provided regarding triggers for emotional eating  DSM-5 Diagnosis(es): 311 (F32.8) Other Specified Depressive Disorder, Emotional Eating Behaviors and 300.00 (F41.9) Unspecified Anxiety Disorder  Treatment Goal & Progress: During the initial appointment with this provider, the following treatment goal was established: increase coping skills. Stacy Thompson has demonstrated progress in her goal as evidenced by increased awareness of hunger patterns.  Plan: The next appointment will be scheduled in two weeks, which will be via MyChart Video Visit. The next session will focus on working towards the established  treatment goal.

## 2020-04-27 ENCOUNTER — Telehealth (INDEPENDENT_AMBULATORY_CARE_PROVIDER_SITE_OTHER): Payer: Self-pay | Admitting: Psychology

## 2020-05-04 ENCOUNTER — Telehealth (INDEPENDENT_AMBULATORY_CARE_PROVIDER_SITE_OTHER): Payer: 59 | Admitting: Psychology

## 2020-05-04 ENCOUNTER — Other Ambulatory Visit: Payer: Self-pay

## 2020-05-04 DIAGNOSIS — F3289 Other specified depressive episodes: Secondary | ICD-10-CM

## 2020-05-04 DIAGNOSIS — F419 Anxiety disorder, unspecified: Secondary | ICD-10-CM

## 2020-05-04 NOTE — Progress Notes (Signed)
Entered in error

## 2020-05-08 ENCOUNTER — Ambulatory Visit (INDEPENDENT_AMBULATORY_CARE_PROVIDER_SITE_OTHER): Payer: 59 | Admitting: Family Medicine

## 2020-05-08 ENCOUNTER — Other Ambulatory Visit: Payer: Self-pay

## 2020-05-08 ENCOUNTER — Encounter (INDEPENDENT_AMBULATORY_CARE_PROVIDER_SITE_OTHER): Payer: Self-pay | Admitting: Family Medicine

## 2020-05-08 VITALS — BP 127/83 | HR 91 | Temp 98.0°F | Ht 64.0 in | Wt 331.0 lb

## 2020-05-08 DIAGNOSIS — Z6841 Body Mass Index (BMI) 40.0 and over, adult: Secondary | ICD-10-CM | POA: Diagnosis not present

## 2020-05-08 DIAGNOSIS — Z9189 Other specified personal risk factors, not elsewhere classified: Secondary | ICD-10-CM

## 2020-05-08 DIAGNOSIS — F3289 Other specified depressive episodes: Secondary | ICD-10-CM | POA: Diagnosis not present

## 2020-05-08 MED ORDER — BUPROPION HCL ER (SR) 100 MG PO TB12: 100.0000 mg | ORAL_TABLET | Freq: Two times a day (BID) | ORAL | 0 refills | Status: DC

## 2020-05-09 NOTE — Progress Notes (Signed)
Chief Complaint:   OBESITY Stacy Thompson is here to discuss her progress with her obesity treatment plan along with follow-up of her obesity related diagnoses. Paulita is on the Category 2 Plan and states she is following her eating plan approximately 95% of the time. Jalilah states she is walking for 30 minutes 4 times per week.  Today's visit was #: 4 Starting weight: 336 lbs Starting date: 03/21/2020 Today's weight: 331 lbs Today's date: 05/08/2020 Total lbs lost to date: 5 lbs Total lbs lost since last in-office visit: 7 lbs  Interim History:  Stacy Thompson says she followed the meal plan more and the pills from the last office visit helped a lot with her emotional eating habits. She has not been crying.  She feels less lethargic than prior.  Subjective:   1. Other Specified Depressive Disorder, Emotional Eating Behaviors  Started bupropion last office visit.  Went well with new medication.  Denies side effects.  Sleeping well without issues. Feels emotionally better.  Met with Dr. Mallie Mussel for the second time and found it very helpful.  She helped her "reflect more" on her actions, being more mindful.  2. At risk for activity intolerance Stacy Thompson will start minimal walking daily.  Assessment/Plan:   1. Other Specified Depressive Disorder, Emotional Eating Behaviors  She will continue with Dr. Mallie Mussel every 2 weeks or so.  Continue bupropion.  Eventually add exercise.  Refill bupropion with no change in dose.  -Refill buPROPion (WELLBUTRIN SR) 100 MG 12 hr tablet; Take 1 tablet (100 mg total) by mouth 2 (two) times daily.  Dispense: 60 tablet; Refill: 0  2. At risk for activity intolerance [name] was given approximately 15 minutes of exercise intolerance counseling today. She is 44 y.o. female and has risk factors exercise intolerance including obesity. We discussed intensive lifestyle modifications today with an emphasis on specific weight loss instructions and strategies. Stacy Thompson will slowly  increase activity as tolerated.  Symia will start minimal walking daily.  3. Class 3 severe obesity with serious comorbidity and body mass index (BMI) of 50.0 to 59.9 in adult, unspecified obesity type ([place]) Stacy Thompson is currently in the action stage of change. As such, her goal is to continue with weight loss efforts. She has agreed to the Category 2 Plan.   Exercise goals: Start walking for 10 minutes daily.  Behavioral modification strategies: increasing lean protein intake, meal planning and cooking strategies, better snacking choices, emotional eating strategies and planning for success.  [name] has agreed to follow-up with our clinic in 2 weeks. She was informed of the importance of frequent follow-up visits to maximize her success with intensive lifestyle modifications for her multiple health conditions.   Objective:   Blood pressure 127/83, pulse 91, temperature 98 F (36.7 C), height _0  (1.626 m), weight (!) 331 lb (150.1 kg), SpO2 97 %, unknown if currently breastfeeding. Body mass index is 56.82 kg/m.  General: Cooperative, alert, well developed, in no acute distress. HEENT: Conjunctivae and lids unremarkable. Cardiovascular: Regular rhythm.  Lungs: Normal work of breathing. Neurologic: No focal deficits.   Lab Results  Component Value Date   CREATININE 0.78 03/21/2020   BUN 10 03/21/2020   NA 138 03/21/2020   K 4.2 03/21/2020   CL 100 03/21/2020   CO2 23 03/21/2020   Lab Results  Component Value Date   ALT 16 03/21/2020   AST 17 03/21/2020   ALKPHOS 65 03/21/2020   BILITOT 0.3 03/21/2020   Lab Results  Component Value  Date   HGBA1C 5.8 (H) 03/21/2020   Lab Results  Component Value Date   INSULIN 6.6 03/21/2020   Lab Results  Component Value Date   TSH 1.870 03/21/2020   Lab Results  Component Value Date   CHOL 175 03/21/2020   HDL 61 03/21/2020   LDLCALC 105 (H) 03/21/2020   TRIG 41 03/21/2020   Lab Results  Component Value Date   WBC 9.9  03/21/2020   HGB 12.5 03/21/2020   HCT 37.7 03/21/2020   MCV 79 03/21/2020   PLT 247 03/21/2020   Attestation Statements:   Reviewed by clinician on day of visit: allergies, medications, problem list, medical history, surgical history, family history, social history, and previous encounter notes.  I, Water quality scientist, CMA, am acting as Location manager for [employer], DO.  I have reviewed the above documentation for accuracy and completeness, and I agree with the above. -  Mellody Dance, DO

## 2020-05-18 ENCOUNTER — Encounter (INDEPENDENT_AMBULATORY_CARE_PROVIDER_SITE_OTHER): Payer: Self-pay

## 2020-05-18 ENCOUNTER — Encounter (INDEPENDENT_AMBULATORY_CARE_PROVIDER_SITE_OTHER): Payer: 59 | Admitting: Psychology

## 2020-05-18 ENCOUNTER — Telehealth (INDEPENDENT_AMBULATORY_CARE_PROVIDER_SITE_OTHER): Payer: Self-pay | Admitting: Psychology

## 2020-05-18 NOTE — Progress Notes (Signed)
Office: [phone]  /  Fax: [phone]    Date: May 24, 2020   Appointment Start Time: 9:21am Duration: 24 minutes Provider: Lawerance Cruel, Psy.D. Type of Session: Individual Therapy  Location of Patient: Home Location of Provider: Provider's Home Type of Contact: Telepsychological Visit via MyChart Video Visit  Session Content: Stacy Thompson is a 44 y.o. female presenting for a follow-up appointment to address the previously established treatment goal of increasing coping skills. Today's appointment was a telepsychological visit due to COVID-19. Stacy Thompson provided verbal consent for today's telepsychological appointment and she is aware she is responsible for securing confidentiality on her end of the session. Prior to proceeding with today's appointment, Stacy Thompson's physical location at the time of this appointment was obtained as well a phone number she could be reached at in the event of technical difficulties. Stacy Thompson and this provider participated in today's telepsychological service.   This provider conducted a brief check-in. Stacy Thompson shared her father was in a MVA yesterday. Associated thoughts and feelings were briefly processed. Psychoeducation regarding the importance of self-care utilizing the oxygen mask metaphor was provided. Psychoeducation regarding pleasurable activities, including its impact on emotional eating and overall well-being was also provided. Stacy Thompson was provided with a handout with various options of pleasurable activities, and was encouraged to engage in one activity a day and additional activities as needed when triggered to emotionally eat. Stacy Thompson agreed. Stacy Thompson provided verbal consent during today's appointment for this provider to send a handout with pleasurable activities via e-mail. Stacy Thompson was agreeable to sitting outside with a cup of coffee and listening to an audio book after today's appointment. She was observed smiling, adding, "That sounds really good." Notably, this  provider discussed her upcoming maternity leave toward the end of November. Stacy Thompson acknowledged understanding given the uncertain nature of the circumstances, this provider may be out of the office sooner. This provider and Stacy Thompson discussed referral options and verbal consent was provided for this provider to send a list of referral options via e-mail. All questions/concerns were addressed. Stacy Thompson denied any concerns. Overall, Stacy Thompson was receptive to today's appointment as evidenced by openness to sharing, responsiveness to feedback, and willingness to engage in pleasurable activities to assist with coping.  Mental Status Examination:  Appearance: well groomed and appropriate hygiene  Behavior: appropriate to circumstances Mood: sad Affect: mood congruent; tearful at times Speech: normal in rate, volume, and tone Eye Contact: appropriate Psychomotor Activity: appropriate Gait: unable to assess Thought Process: linear, logical, and goal directed  Thought Content/Perception: no hallucinations, delusions, bizarre thinking or behavior reported or observed and no evidence of suicidal and homicidal ideation, plan, and intent Orientation: time, person, place, and purpose of appointment Memory/Concentration: memory, attention, language, and fund of knowledge intact  Insight/Judgment: good   Interventions:  Conducted a brief chart review Provided empathic reflections and validation Employed supportive psychotherapy interventions to facilitate reduced distress and to improve coping skills with identified stressors Psychoeducation provided regarding pleasurable activities Psychoeducation provided regarding self-care  DSM-5 Diagnosis(es): 311 (F32.8) Other Specified Depressive Disorder, Emotional Eating Behaviors and 300.00 (F41.9) Unspecified Anxiety Disorder  Treatment Goal & Progress: During the initial appointment with this provider, the following treatment goal was established: increase coping  skills. Caroll has demonstrated progress in her goal as evidenced by increased awareness of hunger patterns and triggers for emotional eating. Kymiah also demonstrates willingness to engage in pleasurable activities.  Plan: The next appointment will be scheduled in two weeks, which will be via MyChart Video Visit. The next session will  focus on working towards the established treatment goal.

## 2020-05-18 NOTE — Telephone Encounter (Signed)
  Office: [phone]  /  Fax: [phone]  Date of Call: May 18, 2020  Time of Call: 10:02am Provider: Lawerance Cruel, PsyD  CONTENT: This provider called Stacy Thompson to check-in as she did not present for today's MyChart Video Visit appointment at 10:00am. A HIPAA compliant voicemail was left requesting a call back. Of note, this provider stayed on the MyChart Video Visit appointment for 5 minutes prior to signing off per the clinic's grace period policy.    PLAN: This provider will wait for [name] to call back. No further follow-up planned by this provider.

## 2020-05-21 ENCOUNTER — Encounter (INDEPENDENT_AMBULATORY_CARE_PROVIDER_SITE_OTHER): Payer: Self-pay | Admitting: Family Medicine

## 2020-05-23 ENCOUNTER — Telehealth (INDEPENDENT_AMBULATORY_CARE_PROVIDER_SITE_OTHER): Payer: 59 | Admitting: Psychology

## 2020-05-24 ENCOUNTER — Ambulatory Visit (INDEPENDENT_AMBULATORY_CARE_PROVIDER_SITE_OTHER): Payer: 59 | Admitting: Family Medicine

## 2020-05-24 ENCOUNTER — Telehealth (INDEPENDENT_AMBULATORY_CARE_PROVIDER_SITE_OTHER): Payer: 59 | Admitting: Psychology

## 2020-05-24 DIAGNOSIS — F419 Anxiety disorder, unspecified: Secondary | ICD-10-CM

## 2020-05-24 DIAGNOSIS — F3289 Other specified depressive episodes: Secondary | ICD-10-CM | POA: Diagnosis not present

## 2020-05-24 NOTE — Progress Notes (Unsigned)
Office: [phone]  /  Fax: [phone]    Date: June 07, 2020   Appointment Start Time: *** Duration: *** minutes Provider: Lawerance Cruel, Psy.D. Type of Session: Individual Therapy  Location of Patient: {gbptloc:23249} Location of Provider: Provider's Home Type of Contact: Telepsychological Visit via MyChart Video Visit  Session Content: Stacy Thompson is a 44 y.o. female presenting for a follow-up appointment to address the previously established treatment goal of increasing coping skills. Today's appointment was a telepsychological visit due to COVID-19. Stacy Thompson provided verbal consent for today's telepsychological appointment and she is aware she is responsible for securing confidentiality on her end of the session. Prior to proceeding with today's appointment, Stacy Thompson's physical location at the time of this appointment was obtained as well a phone number she could be reached at in the event of technical difficulties. Stacy Thompson and this provider participated in today's telepsychological service.   This provider conducted a brief check-in and verbally administered the PHQ-9 and GAD-7. *** Stacy Thompson was receptive to today's appointment as evidenced by openness to sharing, responsiveness to feedback, and {gbreceptiveness:23401}.  Mental Status Examination:  Appearance: {Appearance:22431} Behavior: {Behavior:22445} Mood: {gbmood:21757} Affect: {Affect:22436} Speech: {Speech:22432} Eye Contact: {Eye Contact:22433} Psychomotor Activity: {Motor Activity:22434} Gait: {gbgait:23404} Thought Process: {thought process:22448}  Thought Content/Perception: {disturbances:22451} Orientation: {Orientation:22437} Memory/Concentration: {gbcognition:22449} Insight/Judgment: {Insight:22446}  Structured Assessments Results: The Patient Health Questionnaire-9 (PHQ-9) is a self-report measure that assesses symptoms and severity of depression over the course of the last two weeks. Stacy Thompson obtained a score of ***  suggesting {GBPHQ9SEVERITY:21752}. Stacy Thompson finds the endorsed symptoms to be {gbphq9difficulty:21754}. [0= Not at all; 1= Several days; 2= More than half the days; 3= Nearly every day] Stacy Thompson ***  Stacy Thompson, Stacy Thompson, Stacy Thompson ***  Stacy Thompson, Stacy sleeping too much ***  Stacy Thompson ***  Stacy Thompson ***  Stacy Thompson ***  Stacy Thompson, such as reading the newspaper Stacy watching television ***  Stacy Stacy speaking so slowly that other people could have noticed? Stacy the opposite --- being so fidgety Stacy restless that you have been Stacy around a lot more than usual ***  Thoughts that you would be better off dead Stacy hurting yourself in some way ***  PHQ-9 Score ***    The Generalized Anxiety Disorder-7 (GAD-7) is a brief self-report measure that assesses symptoms of anxiety over the course of the last two weeks. [name] obtained a score of *** suggesting {gbgad7severity:21753}. [name] finds the endorsed symptoms to be {gbphq9difficulty:21754}. [0= Not at all; 1= Several days; 2= Over half the days; 3= Nearly every day] Stacy nervous, anxious, on edge ***  Not being able to stop Stacy control worrying ***  Worrying too much about different Thompson ***  Stacy relaxing ***  Being so restless that it's hard to sit still ***  Becoming easily annoyed Stacy irritable ***  Stacy afraid as if something awful might happen ***  GAD-7 Score ***   Interventions:  {Interventions for Progress Notes:23405}  DSM-5 Diagnosis(es): 311 (F32.8) Other Specified Depressive Disorder, Emotional Eating Behaviors and 300.00 (F41.9) Unspecified Anxiety Disorder  Treatment Goal & Progress: During the initial appointment with this provider, the following treatment goal was established: increase coping skills. [name]  has demonstrated progress in her goal as evidenced by {gbtxprogress:22839}. [name] also {gbtxprogress2:22951}.  Plan: The next appointment will be  scheduled in {gbweeks:21758}, which will be {gbtxmodality:23402}. The next session will focus on {Plan for Next Appointment:23400}.

## 2020-06-07 ENCOUNTER — Telehealth (INDEPENDENT_AMBULATORY_CARE_PROVIDER_SITE_OTHER): Payer: Self-pay | Admitting: Psychology

## 2021-05-02 ENCOUNTER — Emergency Department (HOSPITAL_BASED_OUTPATIENT_CLINIC_OR_DEPARTMENT_OTHER)
Admission: EM | Admit: 2021-05-02 | Discharge: 2021-05-02 | Disposition: A | Discharge status: Home / Self Care | Payer: BC Managed Care – PPO | Attending: Emergency Medicine | Admitting: Emergency Medicine

## 2021-05-02 ENCOUNTER — Other Ambulatory Visit: Payer: Self-pay

## 2021-05-02 ENCOUNTER — Emergency Department (HOSPITAL_BASED_OUTPATIENT_CLINIC_OR_DEPARTMENT_OTHER): Payer: BC Managed Care – PPO

## 2021-05-02 DIAGNOSIS — Z20822 Contact with and (suspected) exposure to covid-19: Secondary | ICD-10-CM | POA: Diagnosis not present

## 2021-05-02 DIAGNOSIS — K5790 Diverticulosis of intestine, part unspecified, without perforation or abscess without bleeding: Secondary | ICD-10-CM

## 2021-05-02 DIAGNOSIS — R1030 Lower abdominal pain, unspecified: Secondary | ICD-10-CM

## 2021-05-02 DIAGNOSIS — K449 Diaphragmatic hernia without obstruction or gangrene: Secondary | ICD-10-CM | POA: Insufficient documentation

## 2021-05-02 DIAGNOSIS — E876 Hypokalemia: Secondary | ICD-10-CM | POA: Insufficient documentation

## 2021-05-02 DIAGNOSIS — R102 Pelvic and perineal pain: Secondary | ICD-10-CM | POA: Diagnosis present

## 2021-05-02 LAB — CBC WITH DIFFERENTIAL/PLATELET
Abs Immature Granulocytes: 0.06 10*3/uL (ref 0.00–0.07)
Basophils Absolute: 0.1 10*3/uL (ref 0.0–0.1)
Basophils Relative: 0 %
Eosinophils Absolute: 0.1 10*3/uL (ref 0.0–0.5)
Eosinophils Relative: 1 %
HCT: 38.5 % (ref 36.0–46.0)
Hemoglobin: 11.8 g/dL — ABNORMAL LOW (ref 12.0–15.0)
Immature Granulocytes: 0 %
Lymphocytes Relative: 38 %
Lymphs Abs: 6.1 10*3/uL — ABNORMAL HIGH (ref 0.7–4.0)
MCH: 25.1 pg — ABNORMAL LOW (ref 26.0–34.0)
MCHC: 30.6 g/dL (ref 30.0–36.0)
MCV: 81.7 fL (ref 80.0–100.0)
Monocytes Absolute: 1.2 10*3/uL — ABNORMAL HIGH (ref 0.1–1.0)
Monocytes Relative: 8 %
Neutro Abs: 8.4 10*3/uL — ABNORMAL HIGH (ref 1.7–7.7)
Neutrophils Relative %: 53 %
Platelets: 277 10*3/uL (ref 150–400)
RBC: 4.71 MIL/uL (ref 3.87–5.11)
RDW: 14.6 % (ref 11.5–15.5)
WBC: 15.9 10*3/uL — ABNORMAL HIGH (ref 4.0–10.5)
nRBC: 0 % (ref 0.0–0.2)

## 2021-05-02 LAB — COMPREHENSIVE METABOLIC PANEL
ALT: 29 U/L (ref 0–44)
AST: 28 U/L (ref 15–41)
Albumin: 4.3 g/dL (ref 3.5–5.0)
Alkaline Phosphatase: 52 U/L (ref 38–126)
Anion gap: 9 (ref 5–15)
BUN: 15 mg/dL (ref 6–20)
CO2: 27 mmol/L (ref 22–32)
Calcium: 9.5 mg/dL (ref 8.9–10.3)
Chloride: 105 mmol/L (ref 98–111)
Creatinine, Ser: 0.81 mg/dL (ref 0.44–1.00)
GFR, Estimated: >60 mL/min (ref >60)
Glucose, Bld: 93 mg/dL (ref 70–99)
Potassium: 3 mmol/L — ABNORMAL LOW (ref 3.5–5.1)
Sodium: 141 mmol/L (ref 135–145)
Total Bilirubin: 0.3 mg/dL (ref 0.3–1.2)
Total Protein: 7.2 g/dL (ref 6.5–8.1)

## 2021-05-02 LAB — URINALYSIS, ROUTINE W REFLEX MICROSCOPIC
Bilirubin Urine: NEGATIVE
Glucose, UA: NEGATIVE mg/dL
Ketones, ur: NEGATIVE mg/dL
Leukocytes,Ua: NEGATIVE
Nitrite: NEGATIVE
Specific Gravity, Urine: 1.036 — ABNORMAL HIGH (ref 1.005–1.030)
pH: 6 (ref 5.0–8.0)

## 2021-05-02 LAB — PREGNANCY, URINE: Preg Test, Ur: NEGATIVE

## 2021-05-02 LAB — WET PREP, GENITAL
Clue Cells Wet Prep HPF POC: NONE SEEN
Sperm: NONE SEEN
Trich, Wet Prep: NONE SEEN
Yeast Wet Prep HPF POC: NONE SEEN

## 2021-05-02 LAB — LIPASE, BLOOD: Lipase: 13 U/L (ref 11–51)

## 2021-05-02 LAB — HIV ANTIBODY (ROUTINE TESTING W REFLEX): HIV Screen 4th Generation wRfx: NONREACTIVE

## 2021-05-02 MED ORDER — IOHEXOL 350 MG/ML SOLN
100.0000 mL | Freq: Once | INTRAVENOUS | Status: AC | PRN
  Administered 2021-05-02: 85 mL via INTRAVENOUS

## 2021-05-02 MED ORDER — POTASSIUM CHLORIDE CRYS ER 20 MEQ PO TBCR
40.0000 meq | EXTENDED_RELEASE_TABLET | Freq: Once | ORAL | Status: AC
  Administered 2021-05-02: 40 meq via ORAL
  Filled 2021-05-02: qty 2

## 2021-05-02 NOTE — ED Provider Notes (Signed)
[facility] EMERGENCY DEPT Provider Note   CSN: [id number] Arrival date & time: 05/02/21  1419     History Chief Complaint  Patient presents with   Pelvic Pain    Stacy Thompson is a 45 y.o. female with no pertinent past medical history who presents today for evaluation of bilateral lower abdominal/hip pain.  Her symptoms started 6 days ago.  They got bad 4 days ago and have been improving since.  She has been taking some left over prednisone and feels like that is why she has gotten better.  She denies any fevers.  She had a last BM this morning.  Her pain started as right sided then over 2 days became bilateral.  Her menstrual cycle started on time for her yesterday.  She is having her normal cramping pain she gets with a cycle.  She is sexually active with a female partner.  She reports they use condoms.  No abnormal discharge.  No urinary symptoms.     HPI     Past Medical History:  Diagnosis Date   Anxiety    Dizziness    Headache    Headache(784.0)    High blood pressure    Joint pain    Lactose intolerance    Left arm pain    Low back pain headach   Lower extremity edema    SOB (shortness of breath)    UTI (lower urinary tract infection)     Patient Active Problem List   Diagnosis Date Noted   Hiatal hernia 05/02/2021   Diverticulosis 05/02/2021   Chlamydia 10/14/2012    Past Surgical History:  Procedure Laterality Date   TONSILLECTOMY     WISDOM TOOTH EXTRACTION       OB History     Gravida  1   Para  0   Term  0   Preterm  0   AB  1   Living  0      SAB  1   IAB  0   Ectopic  0   Multiple  0   Live Births              Family History  Problem Relation Age of Onset   Hypertension Father    Obesity Father    Depression Mother    Breast cancer Neg Hx     Social History   Tobacco Use   Smoking status: Never   Smokeless tobacco: Never  Substance Use Topics   Alcohol use: Yes    Comment: socially   Drug  use: No    Home Medications Prior to Admission medications   Medication Sig Start Date End Date Taking? Authorizing Provider  acetaminophen (TYLENOL) 500 MG tablet Take 1,000 mg by mouth every 6 (six) hours as needed. For pain    [provider]  Aspirin-Acetaminophen-Caffeine (GOODY HEADACHE PO) Take 1 packet by mouth daily as needed (pain).    [provider]  buPROPion (WELLBUTRIN SR) 100 MG 12 hr tablet Take 1 tablet (100 mg total) by mouth 2 (two) times daily. 05/08/20   Opalski, Gavin Pound, DO  Vitamin D, Ergocalciferol, (DRISDOL) 1.25 MG (50000 UNIT) CAPS capsule Take 1 capsule (50,000 Units total) by mouth every 7 (seven) days. 04/19/20   Thomasene Lot, DO    Allergies    Clindamycin/lincomycin and Sulfa antibiotics  Review of Systems   Review of Systems  Respiratory:  Negative for choking and shortness of breath.   Cardiovascular:  Negative for chest  pain.  Gastrointestinal:  Positive for abdominal pain. Negative for constipation, diarrhea and vomiting.  Genitourinary:  Positive for pelvic pain and vaginal bleeding. Negative for decreased urine volume, dysuria, flank pain, frequency, hematuria, urgency and vaginal discharge.  Musculoskeletal:  Negative for back pain and neck pain.       Patient can't tell if she has hip pain  Skin:  Negative for color change and rash.  Neurological:  Negative for weakness and numbness.  Psychiatric/Behavioral:  Negative for confusion.   All other systems reviewed and are negative.  Physical Exam Updated Vital Signs BP (!) 144/67 (BP Location: Right Arm)   Pulse 84   Temp 97.9 F (36.6 C) (Oral)   Resp 17   Ht 5\' 4"  (1.626 m)   Wt (!) 150 kg   SpO2 99%   BMI 56.76 kg/m   Physical Exam Vitals and nursing note reviewed. Exam conducted with a chaperone present (Female EMT).  Constitutional:      General: She is not in acute distress.    Appearance: She is not ill-appearing.  HENT:     Head: Atraumatic.  Eyes:      Conjunctiva/sclera: Conjunctivae normal.  Cardiovascular:     Rate and Rhythm: Normal rate.  Pulmonary:     Effort: Pulmonary effort is normal. No respiratory distress.  Abdominal:     General: There is no distension.     Tenderness: There is no abdominal tenderness. There is no guarding.  Genitourinary:    General: Normal vulva.     Comments: Mild amount of blood in the vaginal canal.  No CMT.  Mild left adnexal tenderness with out mass palpated.  No right adnexal tenderness.  Musculoskeletal:     Cervical back: Normal range of motion and neck supple.     Comments: No obvious acute injury, normal gait.   Skin:    General: Skin is warm.  Neurological:     Mental Status: She is alert.     Comments: Awake and alert, answers all questions appropriately.  Speech is not slurred.  Psychiatric:        Mood and Affect: Mood normal.        Behavior: Behavior normal.    ED Results / Procedures / Treatments   Labs (all labs ordered are listed, but only abnormal results are displayed) Labs Reviewed  WET PREP, GENITAL - Abnormal; Notable for the following components:      Result Value   WBC, Wet Prep HPF POC FEW (*)    All other components within normal limits  CBC WITH DIFFERENTIAL/PLATELET - Abnormal; Notable for the following components:   WBC 15.9 (*)    Hemoglobin 11.8 (*)    MCH 25.1 (*)    Neutro Abs 8.4 (*)    Lymphs Abs 6.1 (*)    Monocytes Absolute 1.2 (*)    All other components within normal limits  COMPREHENSIVE METABOLIC PANEL - Abnormal; Notable for the following components:   Potassium 3.0 (*)    All other components within normal limits  URINALYSIS, ROUTINE W REFLEX MICROSCOPIC - Abnormal; Notable for the following components:   Specific Gravity, Urine 1.036 (*)    Hgb urine dipstick MODERATE (*)    Protein, ur TRACE (*)    All other components within normal limits  SARS CORONAVIRUS 2 (TAT 6-24 HRS)  LIPASE, BLOOD  PREGNANCY, URINE  HIV ANTIBODY (ROUTINE TESTING  W REFLEX)  RPR  GC/CHLAMYDIA PROBE AMP ([place]) NOT AT [facility] [place]  EKG None  Radiology CT Abdomen Pelvis W Contrast  Result Date: 05/02/2021 CLINICAL DATA:  Left lower quadrant abdominal pain. EXAM: CT ABDOMEN AND PELVIS WITH CONTRAST TECHNIQUE: Multidetector CT imaging of the abdomen and pelvis was performed using the standard protocol following bolus administration of intravenous contrast. CONTRAST:  65mL OMNIPAQUE IOHEXOL 350 MG/ML SOLN COMPARISON:  CT stone 07/18/2015. FINDINGS: Lower chest: No acute abnormality. Hepatobiliary: No focal liver abnormality is seen. No gallstones, gallbladder wall thickening, or biliary dilatation. Pancreas: Unremarkable. No pancreatic ductal dilatation or surrounding inflammatory changes. Spleen: Normal in size without focal abnormality. Adrenals/Urinary Tract: There are bilateral peripelvic cysts. There is no hydronephrosis or perinephric fluid. Ureters are nondilated. Bladder is within normal limits. Stomach/Bowel: Stomach is within normal limits. Appendix appears normal. No evidence of bowel wall thickening, distention, or inflammatory changes. There are few scattered colonic diverticula. There is a small hiatal hernia. Vascular/Lymphatic: No significant vascular findings are present. No enlarged abdominal or pelvic lymph nodes. Reproductive: Uterus and bilateral adnexa are unremarkable. Other: There is a small fat containing umbilical hernia. There is no ascites. Musculoskeletal: No acute or significant osseous findings. Degenerative changes affect the spine. IMPRESSION: 1. No acute localizing process in the abdomen or pelvis. 2. Colonic diverticulosis without evidence for acute diverticulitis. 3. Small hiatal hernia. 4. Small fat containing umbilical hernia. Electronically Signed   By: Darliss Cheney M.D.   On: 05/02/2021 21:31    Procedures Procedures   Medications Ordered in ED Medications  potassium chloride SA (KLOR-CON) CR tablet 40 mEq (40 mEq Oral  Given 05/02/21 1912)  iohexol (OMNIPAQUE) 350 MG/ML injection 100 mL (85 mLs Intravenous Contrast Given 05/02/21 2118)    ED Course  I have reviewed the triage vital signs and the nursing notes.  Pertinent labs & imaging results that were available during my care of the patient were reviewed by me and considered in my medical decision making (see chart for details).  Clinical Course as of 05/02/21 2241  Wed May 02, 2021  2155 Pulse Rate(!): 111 In room is 88.  [EH]    Clinical Course User Index [EH] Norman Clay   MDM Rules/Calculators/A&P                          Patient is a 45 year old woman who presents today for evaluation of 6 days of pelvic/lower abdominal pain that has been improving.  It started on the right side, became bilateral lower abdomen.  She started her cycle recently.    She had been taking left over prednisone.    Labs show leukocytosis, however given she has been taking prednisone from an unrelated left over rx this could be drug induced.  She is afebrile.  She is mildly anemic.  CMP shows Hypokalemia with a potassium of 3.0.  She doesn't take any blood pressure medications, suspect from poor PO intake, replete in the ER.   UA is with out infection, does have blood, however patient is on her menstrual cycle.   CT abdomen pelvis obtained given the right sided nature of her symptoms at onset and need to evaluate for appendicitis.  CT AP with out acute abnormalities.  We discussed her incidental findings.  If she had a torsion 6 days ago I would have expected to see it on her CT scan with contrast.  She does not have CMT and does not endorse concerns for STI.    Unclear what is causing patients  symptoms.  Recommended PCP follow up.  Given her symptoms including some aching in her hips will also send covid test.    Return precautions were discussed with patient who states their understanding.  At the time of discharge patient denied any unaddressed  complaints or concerns.  Patient is agreeable for discharge home.  Note: Portions of this report may have been transcribed using voice recognition software. Every effort was made to ensure accuracy; however, inadvertent computerized transcription errors may be present    Final Clinical Impression(s) / ED Diagnoses Final diagnoses:  Lower abdominal pain  Diverticulosis  Hypokalemia  Hiatal hernia    Rx / DC Orders ED Discharge Orders     None        Cristina Gong, PA-C 05/02/21 2248    Virgina Norfolk, DO 05/02/21 2340

## 2021-05-02 NOTE — ED Notes (Signed)
Pelvic exam done by Lanora Manis - PA and Kenney Houseman - EMT assisted.

## 2021-05-02 NOTE — ED Triage Notes (Signed)
Pt states pelvic pain since Thursday, started mentrual cycle yesterday.  Denies any urinary symptoms, normal BM today.

## 2021-05-02 NOTE — Discharge Instructions (Signed)
If at any point your pain becomes more one-sided again, you develop fevers, your pain is uncontrollable, you are unable to tolerate food and water, you develop any leg numbness or weakness, or have any new concerns or symptoms please seek additional medical care and evaluation. Please keep your appointment for your primary care doctor and for your OB/GYN.

## 2021-05-02 NOTE — ED Notes (Signed)
Patient returned from CT

## 2021-05-03 LAB — SARS CORONAVIRUS 2 (TAT 6-24 HRS): SARS Coronavirus 2: NEGATIVE

## 2021-05-03 LAB — RPR: RPR Ser Ql: NONREACTIVE

## 2021-05-04 LAB — GC/CHLAMYDIA PROBE AMP (~~LOC~~) NOT AT ARMC
Chlamydia: NEGATIVE
Comment: NEGATIVE
Comment: NORMAL
Neisseria Gonorrhea: NEGATIVE

## 2021-06-05 ENCOUNTER — Other Ambulatory Visit: Payer: Self-pay

## 2021-06-05 ENCOUNTER — Encounter: Payer: Self-pay | Admitting: Nurse Practitioner

## 2021-06-05 ENCOUNTER — Ambulatory Visit: Payer: BC Managed Care – PPO | Admitting: Nurse Practitioner

## 2021-06-05 VITALS — BP 126/80 | HR 70 | Temp 98.4°F | Ht 64.4 in | Wt 355.0 lb

## 2021-06-05 DIAGNOSIS — Z7689 Persons encountering health services in other specified circumstances: Secondary | ICD-10-CM

## 2021-06-05 DIAGNOSIS — F320 Major depressive disorder, single episode, mild: Secondary | ICD-10-CM

## 2021-06-05 DIAGNOSIS — R5383 Other fatigue: Secondary | ICD-10-CM

## 2021-06-05 DIAGNOSIS — E559 Vitamin D deficiency, unspecified: Secondary | ICD-10-CM | POA: Diagnosis not present

## 2021-06-05 DIAGNOSIS — Z13228 Encounter for screening for other metabolic disorders: Secondary | ICD-10-CM

## 2021-06-05 DIAGNOSIS — Z6841 Body Mass Index (BMI) 40.0 and over, adult: Secondary | ICD-10-CM

## 2021-06-05 MED ORDER — ESCITALOPRAM OXALATE 10 MG PO TABS: 10.0000 mg | ORAL_TABLET | Freq: Every day | ORAL | 2 refills | Status: DC

## 2021-06-05 NOTE — Addendum Note (Signed)
Addended by: Delma Officer on: 06/05/2021 05:28 PM   Modules accepted: Orders

## 2021-06-05 NOTE — Progress Notes (Addendum)
I,Katawbba Wiggins,acting as a Education administrator for [employer], NP.,have documented all relevant documentation on the behalf of [employer], NP,as directed by  Bary Castilla, NP while in the presence of Bary Castilla, NP.  This visit occurred during the SARS-CoV-2 public health emergency.  Safety protocols were in place, including screening questions prior to the visit, additional usage of staff PPE, and extensive cleaning of exam room while observing appropriate contact time as indicated for disinfecting solutions.  Subjective:     Patient ID: Stacy Thompson , female    DOB: 16-Jul-1976 , 45 y.o.   MRN: [id number]   Chief Complaint  Patient presents with   Establish Care   Allergic Rhinitis     HPI  The patient is here to establish care.  The patient states she's having problems with sinus drainage that has been going on for about 6 mths. The patient states she's had a covid test at ER and 2 weeks ago at home. She is also here for weakness and weight gain as well as depression.     Past Medical History:  Diagnosis Date   Anxiety    Dizziness    Headache    Headache(784.0)    High blood pressure    Joint pain    Lactose intolerance    Left arm pain    Low back pain headach   Lower extremity edema    SOB (shortness of breath)    UTI (lower urinary tract infection)      Family History  Problem Relation Age of Onset   Depression Mother    Hypertension Father    Obesity Father    Breast cancer Neg Hx      Current Outpatient Medications:    escitalopram (LEXAPRO) 10 MG tablet, Take 1 tablet (10 mg total) by mouth daily., Disp: 30 tablet, Rfl: 2   acetaminophen (TYLENOL) 500 MG tablet, Take 1,000 mg by mouth every 6 (six) hours as needed. For pain (Patient not taking: Reported on 06/05/2021), Disp: , Rfl:    Aspirin-Acetaminophen-Caffeine (GOODY HEADACHE PO), Take 1 packet by mouth daily as needed (pain). (Patient not taking: Reported on 06/05/2021), Disp: , Rfl:     Vitamin D, Ergocalciferol, (DRISDOL) 1.25 MG (50000 UNIT) CAPS capsule, Take 1 capsule (50,000 Units total) by mouth every 7 (seven) days. (Patient not taking: Reported on 06/05/2021), Disp: 4 capsule, Rfl: 0   Allergies  Allergen Reactions   Clindamycin/Lincomycin Shortness Of Breath   Sulfa Antibiotics Hives, Shortness Of Breath and Swelling     Review of Systems  Constitutional:  Positive for fatigue. Negative for chills and fever.  HENT:  Positive for postnasal drip. Negative for congestion, sinus pressure and sinus pain.   Respiratory:  Negative for shortness of breath and wheezing.   Cardiovascular:  Negative for chest pain and palpitations.  Neurological:  Negative for weakness and headaches.    Today's Vitals   06/05/21 1536  BP: 126/80  Pulse: 70  Temp: 98.4 F (36.9 C)  Weight: (!) 355 lb (161 kg)  Height: 5' 4.4" (1.636 m)   Body mass index is 60.18 kg/m.   Objective:  Physical Exam Constitutional:      Appearance: Normal appearance. She is obese.  HENT:     Head: Normocephalic and atraumatic.  Cardiovascular:     Rate and Rhythm: Normal rate and regular rhythm.     Pulses: Normal pulses.     Heart sounds: Normal heart sounds. No murmur heard. Pulmonary:  Effort: Pulmonary effort is normal. No respiratory distress.     Breath sounds: Normal breath sounds. No wheezing.  Skin:    Capillary Refill: Capillary refill takes less than 2 seconds.  Neurological:     Mental Status: She is alert and oriented to person, place, and time.        Assessment And Plan:     1. Establishing care with new doctor, encounter for --Patient is here to establish care. Martin Majestic over patient medical, family, social and surgical history. -Reviewed with patient their medications and any allergies  -Reviewed with patient their sexual orientation, drug/tobacco and alcohol use -Dicussed any new concerns with patient  -recommended patient comes in for a physical exam and complete  blood work.  -Educated patient about the importance of annual screenings and immunizations.  -Advised patient to eat a healthy diet along with exercise for atleast 30-45 min atleast 4-5 days of the week.   2. Current mild episode of major depressive disorder, unspecified whether recurrent ([place]) -Advised patient to use support at home  - escitalopram (LEXAPRO) 10 MG tablet; Take 1 tablet (10 mg total) by mouth daily.  Dispense: 30 tablet; Refill: 2 - Ambulatory referral to Psychology  3. Other fatigue - TSH + free T4 - CBC no Diff - CMP14+EGFR  4. Vitamin D deficiency -Will check for vit D and supplement if needed . - Vitamin D (25 hydroxy)  5. Encounter for screening for metabolic disorder - Hemoglobin A1c - CMP14+EGFR  6. Class 3 severe obesity due to excess calories without serious comorbidity with body mass index (BMI) of 60.0 to 69.9 in adult ([place])  -Pt. will try lifestyle modification -Advised patient on a healthy diet including avoiding fast food and red meats. Increase the intake of lean meats including grilled chicken and [place].  Drink a lot of water. Decrease intake of fatty foods. Exercise for 30-45 min. 4-5 a week to decrease the risk of cardiac event.   The patient was encouraged to call or send a message through [place] for any questions or concerns.   Follow up: if symptoms persist or do not get better.   Side effects and appropriate use of all the medication(s) were discussed with the patient today. Patient advised to use the medication(s) as directed by their healthcare provider. The patient was encouraged to read, review, and understand all associated package inserts and contact our office with any questions or concerns. The patient accepts the risks of the treatment plan and had an opportunity to ask questions.   Staying healthy and adopting a healthy lifestyle for your overall health is important. You should eat 7 or more servings of fruits and vegetables per day.  You should drink plenty of water to keep yourself hydrated and your kidneys healthy. This includes about 65-80+ fluid ounces of water. Limit your intake of animal fats especially for elevated cholesterol. Avoid highly processed food and limit your salt intake if you have hypertension. Avoid foods high in saturated/Trans fats. Along with a healthy diet it is also very important to maintain time for yourself to maintain a healthy mental health with low stress levels. You should get atleast 150 min of moderate intensity exercise weekly for a healthy heart. Along with eating right and exercising, aim for at least 7-9 hours of sleep daily.  Eat more whole grains which includes barley, wheat berries, oats, brown rice and whole wheat pasta. Use healthy plant oils which include olive, soy, corn, sunflower and peanut. Limit your caffeine  and sugary drinks. Limit your intake of fast foods. Limit milk and dairy products to one or two daily servings.   Patient was given opportunity to ask questions. Patient verbalized understanding of the plan and was able to repeat key elements of the plan. All questions were answered to their satisfaction.  Raman Nansi [place], DNP   I, Raman Taylyn Brame have reviewed all documentation for this visit. The documentation on 06/05/21 for the exam, diagnosis, procedures, and orders are all accurate and complete.    IF YOU HAVE BEEN REFERRED TO A SPECIALIST, IT MAY TAKE 1-2 WEEKS TO SCHEDULE/PROCESS THE REFERRAL. IF YOU HAVE NOT HEARD FROM US/SPECIALIST IN TWO WEEKS, PLEASE GIVE [place] A CALL AT [phone].   THE PATIENT IS ENCOURAGED TO PRACTICE SOCIAL DISTANCING DUE TO THE COVID-19 PANDEMIC.

## 2021-06-06 ENCOUNTER — Other Ambulatory Visit: Payer: Self-pay | Admitting: Nurse Practitioner

## 2021-06-06 DIAGNOSIS — E559 Vitamin D deficiency, unspecified: Secondary | ICD-10-CM

## 2021-06-06 DIAGNOSIS — R7303 Prediabetes: Secondary | ICD-10-CM

## 2021-06-06 LAB — CMP14+EGFR
ALT: 12 IU/L (ref 0–32)
AST: 18 IU/L (ref 0–40)
Albumin/Globulin Ratio: 2 (ref 1.2–2.2)
Albumin: 4.6 g/dL (ref 3.8–4.8)
Alkaline Phosphatase: 70 IU/L (ref 44–121)
BUN/Creatinine Ratio: 11 (ref 9–23)
BUN: 8 mg/dL (ref 6–24)
Bilirubin Total: 0.2 mg/dL (ref 0.0–1.2)
CO2: 22 mmol/L (ref 20–29)
Calcium: 9.5 mg/dL (ref 8.7–10.2)
Chloride: 99 mmol/L (ref 96–106)
Creatinine, Ser: 0.76 mg/dL (ref 0.57–1.00)
Globulin, Total: 2.3 g/dL (ref 1.5–4.5)
Glucose: 84 mg/dL (ref 70–99)
Potassium: 4.1 mmol/L (ref 3.5–5.2)
Sodium: 137 mmol/L (ref 134–144)
Total Protein: 6.9 g/dL (ref 6.0–8.5)
eGFR: 97 mL/min/{1.73_m2} (ref >59)

## 2021-06-06 LAB — CBC
Hematocrit: 38.6 % (ref 34.0–46.6)
Hemoglobin: 12.2 g/dL (ref 11.1–15.9)
MCH: 25.5 pg — ABNORMAL LOW (ref 26.6–33.0)
MCHC: 31.6 g/dL (ref 31.5–35.7)
MCV: 81 fL (ref 79–97)
Platelets: 243 10*3/uL (ref 150–450)
RBC: 4.79 x10E6/uL (ref 3.77–5.28)
RDW: 13.3 % (ref 11.7–15.4)
WBC: 9 10*3/uL (ref 3.4–10.8)

## 2021-06-06 LAB — HEMOGLOBIN A1C
Est. average glucose Bld gHb Est-mCnc: 126 mg/dL
Hgb A1c MFr Bld: 6 % — ABNORMAL HIGH (ref 4.8–5.6)

## 2021-06-06 LAB — TSH+FREE T4
Free T4: 1.26 ng/dL (ref 0.82–1.77)
TSH: 3.6 u[IU]/mL (ref 0.450–4.500)

## 2021-06-06 LAB — VITAMIN D 25 HYDROXY (VIT D DEFICIENCY, FRACTURES): Vit D, 25-Hydroxy: 18.6 ng/mL — ABNORMAL LOW (ref 30.0–100.0)

## 2021-06-06 MED ORDER — METFORMIN HCL 500 MG PO TABS: 500.0000 mg | ORAL_TABLET | Freq: Two times a day (BID) | ORAL | 2 refills | Status: DC

## 2021-06-06 MED ORDER — VITAMIN D (ERGOCALCIFEROL) 1.25 MG (50000 UNIT) PO CAPS: 50000.0000 [IU] | ORAL_CAPSULE | ORAL | 1 refills | Status: DC

## 2021-06-23 ENCOUNTER — Encounter (HOSPITAL_COMMUNITY): Payer: Self-pay | Admitting: Emergency Medicine

## 2021-06-23 ENCOUNTER — Emergency Department (HOSPITAL_COMMUNITY)
Admission: EM | Admit: 2021-06-23 | Discharge: 2021-06-23 | Disposition: A | Discharge status: Home / Self Care | Payer: BC Managed Care – PPO | Attending: Emergency Medicine | Admitting: Emergency Medicine

## 2021-06-23 ENCOUNTER — Other Ambulatory Visit: Payer: Self-pay

## 2021-06-23 ENCOUNTER — Emergency Department (HOSPITAL_COMMUNITY): Payer: BC Managed Care – PPO

## 2021-06-23 DIAGNOSIS — R42 Dizziness and giddiness: Secondary | ICD-10-CM | POA: Diagnosis present

## 2021-06-23 DIAGNOSIS — R791 Abnormal coagulation profile: Secondary | ICD-10-CM | POA: Insufficient documentation

## 2021-06-23 DIAGNOSIS — R110 Nausea: Secondary | ICD-10-CM | POA: Insufficient documentation

## 2021-06-23 LAB — DIFFERENTIAL
Abs Immature Granulocytes: 0.02 10*3/uL (ref 0.00–0.07)
Basophils Absolute: 0 10*3/uL (ref 0.0–0.1)
Basophils Relative: 1 %
Eosinophils Absolute: 0.3 10*3/uL (ref 0.0–0.5)
Eosinophils Relative: 3 %
Immature Granulocytes: 0 %
Lymphocytes Relative: 34 %
Lymphs Abs: 2.9 10*3/uL (ref 0.7–4.0)
Monocytes Absolute: 0.6 10*3/uL (ref 0.1–1.0)
Monocytes Relative: 7 %
Neutro Abs: 4.7 10*3/uL (ref 1.7–7.7)
Neutrophils Relative %: 55 %

## 2021-06-23 LAB — COMPREHENSIVE METABOLIC PANEL
ALT: 16 U/L (ref 0–44)
AST: 11 U/L — ABNORMAL LOW (ref 15–41)
Albumin: 3.6 g/dL (ref 3.5–5.0)
Alkaline Phosphatase: 46 U/L (ref 38–126)
Anion gap: 8 (ref 5–15)
BUN: 8 mg/dL (ref 6–20)
CO2: 24 mmol/L (ref 22–32)
Calcium: 9 mg/dL (ref 8.9–10.3)
Chloride: 104 mmol/L (ref 98–111)
Creatinine, Ser: 0.81 mg/dL (ref 0.44–1.00)
GFR, Estimated: >60 mL/min (ref >60)
Glucose, Bld: 108 mg/dL — ABNORMAL HIGH (ref 70–99)
Potassium: 3.6 mmol/L (ref 3.5–5.1)
Sodium: 136 mmol/L (ref 135–145)
Total Bilirubin: 0.4 mg/dL (ref 0.3–1.2)
Total Protein: 6.6 g/dL (ref 6.5–8.1)

## 2021-06-23 LAB — CBC
HCT: 38.2 % (ref 36.0–46.0)
Hemoglobin: 11.7 g/dL — ABNORMAL LOW (ref 12.0–15.0)
MCH: 25.4 pg — ABNORMAL LOW (ref 26.0–34.0)
MCHC: 30.6 g/dL (ref 30.0–36.0)
MCV: 82.9 fL (ref 80.0–100.0)
Platelets: 257 10*3/uL (ref 150–400)
RBC: 4.61 MIL/uL (ref 3.87–5.11)
RDW: 14.5 % (ref 11.5–15.5)
WBC: 8.5 10*3/uL (ref 4.0–10.5)
nRBC: 0 % (ref 0.0–0.2)

## 2021-06-23 LAB — I-STAT CHEM 8, ED
BUN: 9 mg/dL (ref 6–20)
Calcium, Ion: 1.21 mmol/L (ref 1.15–1.40)
Chloride: 104 mmol/L (ref 98–111)
Creatinine, Ser: 0.8 mg/dL (ref 0.44–1.00)
Glucose, Bld: 110 mg/dL — ABNORMAL HIGH (ref 70–99)
HCT: 39 % (ref 36.0–46.0)
Hemoglobin: 13.3 g/dL (ref 12.0–15.0)
Potassium: 3.7 mmol/L (ref 3.5–5.1)
Sodium: 140 mmol/L (ref 135–145)
TCO2: 25 mmol/L (ref 22–32)

## 2021-06-23 LAB — APTT: aPTT: 31 seconds (ref 24–36)

## 2021-06-23 LAB — PROTIME-INR
INR: 1.1 (ref 0.8–1.2)
Prothrombin Time: 14 seconds (ref 11.4–15.2)

## 2021-06-23 LAB — I-STAT BETA HCG BLOOD, ED (MC, WL, AP ONLY): I-stat hCG, quantitative: <5 m[IU]/mL (ref <5)

## 2021-06-23 MED ORDER — MECLIZINE HCL 25 MG PO TABS: 25.0000 mg | ORAL_TABLET | Freq: Three times a day (TID) | ORAL | 0 refills | Status: DC | PRN

## 2021-06-23 MED ORDER — SODIUM CHLORIDE 0.9% FLUSH
3.0000 mL | Freq: Once | INTRAVENOUS | Status: DC
Start: 2021-06-23 — End: 2021-06-23

## 2021-06-23 MED ORDER — ONDANSETRON 4 MG PO TBDP: 4.0000 mg | ORAL_TABLET | Freq: Three times a day (TID) | ORAL | 0 refills | Status: DC | PRN

## 2021-06-23 NOTE — ED Provider Notes (Signed)
[facility] EMERGENCY DEPARTMENT Provider Note   CSN: [id number] Arrival date & time: 06/23/21  0416    History Chief Complaint  Patient presents with   Dizziness    Stacy Thompson is a 45 y.o. female with no significant past medical history who presents for evaluation of  dizziness. Awoke this morning to dizziness which she describes as room spinning, associated nausea. Attempted to sit up and sx worsened. Sx lasted for 5 minutes and self resolved. No associated HA, blurred vision, visual field cuts, neck pain, CP, SOB, abd pain, weakness, numbness, falls or injuries. Symptoms resolved after about 5 minutes and is currently asymptomatic. Has not had anything like this previously. No sudden onset HA. Did say she did not eat anything for dinner yesterday. No hx of CVA, dissection, connective tissue disorders. Denies additional aggravating or alleviating factors.   History obtained from patient and past medical records. No interpretor was used.   HPI     Past Medical History:  Diagnosis Date   Anxiety    Dizziness    Headache    Headache(784.0)    High blood pressure    Joint pain    Lactose intolerance    Left arm pain    Low back pain headach   Lower extremity edema    SOB (shortness of breath)    UTI (lower urinary tract infection)     Patient Active Problem List   Diagnosis Date Noted   Hiatal hernia 05/02/2021   Diverticulosis 05/02/2021   Chlamydia 10/14/2012    Past Surgical History:  Procedure Laterality Date   TONSILLECTOMY     WISDOM TOOTH EXTRACTION       OB History     Gravida  1   Para  0   Term  0   Preterm  0   AB  1   Living  0      SAB  1   IAB  0   Ectopic  0   Multiple  0   Live Births              Family History  Problem Relation Age of Onset   Depression Mother    Hypertension Father    Obesity Father    Breast cancer Neg Hx     Social History   Tobacco Use   Smoking status: Never    Smokeless tobacco: Never  Substance Use Topics   Alcohol use: Yes    Comment: socially   Drug use: No    Home Medications Prior to Admission medications   Medication Sig Start Date End Date Taking? Authorizing Provider  meclizine (ANTIVERT) 25 MG tablet Take 1 tablet (25 mg total) by mouth 3 (three) times daily as needed for dizziness. 06/23/21  Yes Sabria Florido A, PA-C  ondansetron (ZOFRAN ODT) 4 MG disintegrating tablet Take 1 tablet (4 mg total) by mouth every 8 (eight) hours as needed for nausea or vomiting. 06/23/21  Yes Mairyn Lenahan A, PA-C  escitalopram (LEXAPRO) 10 MG tablet Take 1 tablet (10 mg total) by mouth daily. 06/05/21 09/03/21  Charlesetta Ivory, NP  metFORMIN (GLUCOPHAGE) 500 MG tablet Take 1 tablet (500 mg total) by mouth 2 (two) times daily with a meal. 06/06/21 09/04/21  Ghumman, Ramandeep, NP  Vitamin D, Ergocalciferol, (DRISDOL) 1.25 MG (50000 UNIT) CAPS capsule Take 1 capsule (50,000 Units total) by mouth every 7 (seven) days. 06/06/21   Charlesetta Ivory, NP    Allergies    Clindamycin/lincomycin  and Sulfa antibiotics  Review of Systems   Review of Systems  Constitutional: Negative.   HENT: Negative.    Respiratory: Negative.    Cardiovascular: Negative.   Gastrointestinal:  Positive for nausea. Negative for abdominal distention, abdominal pain, diarrhea and vomiting.  Genitourinary: Negative.   Musculoskeletal: Negative.   Skin: Negative.   Neurological:  Positive for dizziness. Negative for tremors, seizures, syncope, facial asymmetry, speech difficulty, weakness, light-headedness, numbness and headaches.  All other systems reviewed and are negative.  Physical Exam Updated Vital Signs BP (!) 157/85   Pulse 71   Temp 97.9 F (36.6 C) (Oral)   Resp 17   LMP 05/28/2021   SpO2 100%   Physical Exam Physical Exam  Constitutional: Pt is oriented to person, place, and time. Pt appears well-developed and well-nourished. No distress.  HENT:  Head:  Normocephalic and atraumatic.  Mouth/Throat: Oropharynx is clear and moist.  Eyes: Conjunctivae and EOM are normal. Pupils are equal, round, and reactive to light. No scleral icterus. 2 beats horizontal nystagmus to right No horizontal, vertical or rotational nystagmus  Neck: Normal range of motion. Neck supple.  Full active and passive ROM without pain No midline or paraspinal tenderness No nuchal rigidity or meningeal signs  Cardiovascular: Normal rate, regular rhythm and intact distal pulses.   Pulmonary/Chest: Effort normal and breath sounds normal. No respiratory distress. Pt has no wheezes. No rales.  Abdominal: Soft. Bowel sounds are normal. There is no tenderness. There is no rebound and no guarding.  Musculoskeletal: Normal range of motion.  Lymphadenopathy:    No cervical adenopathy.  Neurological: Pt. is alert and oriented to person, place, and time. He has normal reflexes. No cranial nerve deficit.  Exhibits normal muscle tone. Coordination normal.  Mental Status:  Alert, oriented, thought content appropriate. Speech fluent without evidence of aphasia. Able to follow 2 step commands without difficulty.  Cranial Nerves:  II:  Peripheral visual fields grossly normal, pupils equal, round, reactive to light III,IV, VI: ptosis not present, extra-ocular motions intact bilaterally  V,VII: smile symmetric, facial light touch sensation equal VIII: hearing grossly normal bilaterally  IX,X: midline uvula rise  XI: bilateral shoulder shrug equal and strong XII: midline tongue extension  Motor:  5/5 in upper and lower extremities bilaterally including strong and equal grip strength and dorsiflexion/plantar flexion Sensory: Pinprick and light touch normal in all extremities.  Deep Tendon Reflexes: 2+ and symmetric  Cerebellar: normal finger-to-nose with bilateral upper extremities Gait: normal gait and balance CV: distal pulses palpable throughout   Skin: Skin is warm and dry. No rash  noted. Pt is not diaphoretic.  Psychiatric: Pt has a normal mood and affect. Behavior is normal. Judgment and thought content normal.  Nursing note and vitals reviewed.  ED Results / Procedures / Treatments   Labs (all labs ordered are listed, but only abnormal results are displayed) Labs Reviewed  CBC - Abnormal; Notable for the following components:      Result Value   Hemoglobin 11.7 (*)    MCH 25.4 (*)    All other components within normal limits  COMPREHENSIVE METABOLIC PANEL - Abnormal; Notable for the following components:   Glucose, Bld 108 (*)    AST 11 (*)    All other components within normal limits  I-STAT CHEM 8, ED - Abnormal; Notable for the following components:   Glucose, Bld 110 (*)    All other components within normal limits  PROTIME-INR  APTT  DIFFERENTIAL  I-STAT BETA HCG  BLOOD, ED ([facility], [facility], [facility] ONLY)  CBG MONITORING, ED    EKG None  Radiology CT HEAD WO CONTRAST  Result Date: 06/23/2021 CLINICAL DATA:  45 year old female with dizziness. EXAM: CT HEAD WITHOUT CONTRAST TECHNIQUE: Contiguous axial images were obtained from the base of the skull through the vertex without intravenous contrast. COMPARISON:  Head CT 04/12/2004. FINDINGS: Brain: Small cavum septum pellucidum, normal variant. Cerebral volume, ventricle size and configuration has not significantly changed since 2005. Chronic partially empty sella (sagittal image 35). No midline shift, mass effect, evidence of mass lesion, intracranial hemorrhage or evidence of cortically based acute infarction. Chronic scattered white matter hypodensity, probable dilated perivascular spaces in the subcortical white matter and stable. Otherwise gray-white matter differentiation is within normal limits. Vascular: No suspicious intracranial vascular hyperdensity. Skull: Negative. Sinuses/Orbits: Visualized paranasal sinuses and mastoids are clear. Tympanic cavities are clear. Other: Visualized orbits and scalp soft tissues  are within normal limits. IMPRESSION: 1. No acute intracranial abnormality. 2. Chronic partially empty sella, often a normal anatomic variant but can be associated with idiopathic intracranial hypertension (pseudotumor cerebri). Electronically Signed   By: Odessa Fleming M.D.   On: 06/23/2021 05:56    Procedures Procedures   Medications Ordered in ED Medications  sodium chloride flush (NS) 0.9 % injection 3 mL (has no administration in time range)   ED Course  I have reviewed the triage vital signs and the nursing notes.  Pertinent labs & imaging results that were available during my care of the patient were reviewed b  Here for evaluation of dizziness which began when she awoke this morning. Afebrile, non affect, not ill-appearing.  Symptoms resolved prior to arrival.  Patient describes dizziness as room spinning with associated nausea.  Began after turning over in bed.  No recent head injury.  She has a nonfocal neuro exam without deficits.  No chest pain, shortness of breath.  No visual field cuts, neck pain.  No sudden onset thunderclap headache.  Work-up started from triage which I personally reviewed and interpreted:  Ct head without acute findings CBC no leukocytosis, Hgb 11.7, similar to prior CMP glucose 108 no additional abnormality EKG without ischemic changes  Discussed labs and imaging with patient.  Reassuring work-up.  Suspect given benign exam resolved symptoms likely vertiginous in nature.  DC home with meclizine, Zofran as needed.  Patient with unilateral nystagmus, negative skew test and normal neurologic exam.  No vertical or rotational nystagmus. Patient normal finger-nose and normal gait.  No slurred speech renal or weakness.  Doubt CVA, dissection or other central cause of vertigo.  History and physical consistent with peripheral vertigo symptoms.  We'll discharge home with meclizine. Patient instructed to followup with her primary care physician or neurology within 3 days  for further evaluation. They are to return to the emergency department for new neurologic symptoms, loss of vision or other concerning symptoms.     MDM Rules/Calculators/A&P                            Final Clinical Impression(s) / ED Diagnoses Final diagnoses:  Dizziness    Rx / DC Orders ED Discharge Orders          Ordered    meclizine (ANTIVERT) 25 MG tablet  3 times daily PRN        06/23/21 0835    ondansetron (ZOFRAN ODT) 4 MG disintegrating tablet  Every 8 hours PRN  06/23/21 0835             Maleiyah Releford A, PA-C 06/23/21 0845    Benjiman Core, MD 06/23/21 1549

## 2021-06-23 NOTE — Discharge Instructions (Addendum)
Your symptoms are likely related to vertigo.  Your work-up was reassuring here in the emergency department  If you develop severe, persistent dizziness, difficulty walking, numbness or weakness, vision changes, sudden onset headache please seek reevaluation emergency department

## 2021-06-23 NOTE — ED Triage Notes (Signed)
Patient with dizziness and nausea that started when she woke up an hour PTA of EMS.  Patient states that she has had vertigo before but this feels different.  She describes as the room is spinning and she has never had that before.

## 2021-07-17 ENCOUNTER — Encounter: Payer: BC Managed Care – PPO | Admitting: Nurse Practitioner

## 2021-07-17 NOTE — Progress Notes (Signed)
No show

## 2021-07-17 NOTE — Patient Instructions (Signed)

## 2021-07-18 ENCOUNTER — Encounter: Payer: Self-pay | Admitting: Nurse Practitioner

## 2021-07-18 ENCOUNTER — Ambulatory Visit: Payer: BC Managed Care – PPO | Admitting: Nurse Practitioner

## 2021-07-18 ENCOUNTER — Other Ambulatory Visit: Payer: Self-pay

## 2021-07-18 VITALS — BP 122/80 | HR 88 | Temp 97.8°F | Ht 64.4 in | Wt 353.2 lb

## 2021-07-18 DIAGNOSIS — Z23 Encounter for immunization: Secondary | ICD-10-CM | POA: Diagnosis not present

## 2021-07-18 DIAGNOSIS — E559 Vitamin D deficiency, unspecified: Secondary | ICD-10-CM | POA: Diagnosis not present

## 2021-07-18 DIAGNOSIS — Z6841 Body Mass Index (BMI) 40.0 and over, adult: Secondary | ICD-10-CM

## 2021-07-18 DIAGNOSIS — R7303 Prediabetes: Secondary | ICD-10-CM

## 2021-07-18 DIAGNOSIS — F320 Major depressive disorder, single episode, mild: Secondary | ICD-10-CM | POA: Diagnosis not present

## 2021-07-18 MED ORDER — RYBELSUS 7 MG PO TABS: ORAL_TABLET | ORAL | 2 refills | Status: DC

## 2021-07-18 MED ORDER — VITAMIN D (ERGOCALCIFEROL) 1.25 MG (50000 UNIT) PO CAPS: 50000.0000 [IU] | ORAL_CAPSULE | ORAL | 0 refills | Status: AC

## 2021-07-18 MED ORDER — BUPROPION HCL ER (SR) 100 MG PO TB12: 100.0000 mg | ORAL_TABLET | Freq: Two times a day (BID) | ORAL | 0 refills | Status: DC

## 2021-07-18 NOTE — Patient Instructions (Signed)

## 2021-07-18 NOTE — Progress Notes (Signed)
I,Jameka J Llittleton,acting as a Neurosurgeon for [employer], NP.,have documented all relevant documentation on the behalf of [employer], NP,as directed by  Charlesetta Ivory, NP while in the presence of Charlesetta Ivory, NP.  This visit occurred during the SARS-CoV-2 public health emergency.  Safety protocols were in place, including screening questions prior to the visit, additional usage of staff PPE, and extensive cleaning of exam room while observing appropriate contact time as indicated for disinfecting solutions.  Subjective:     Patient ID: Stacy Thompson , female    DOB: 03-21-1976 , 45 y.o.   MRN: [id number]  Chief Complaint  Patient presents with   Weight Check   Depression   HPI  Patient presents today for a depression f/u and weight check. She stated she was started on lexapro but she stopped taking the medication because it made her groggy and just didn't work well for her. She was also started on the rybelsus and has some issues with it at first but she is now tolerating it well. Wt Readings from Last 3 Encounters: 07/18/21 : (!) 353 lb 3.2 oz (160.2 kg) 06/05/21 : (!) 355 lb (161 kg) 05/02/21 : (!) 330 lb 11 oz (150 kg)     Past Medical History:  Diagnosis Date   Anxiety    Dizziness    Headache    Headache(784.0)    High blood pressure    Joint pain    Lactose intolerance    Left arm pain    Low back pain headach   Lower extremity edema    SOB (shortness of breath)    UTI (lower urinary tract infection)      Family History  Problem Relation Age of Onset   Depression Mother    Hypertension Father    Obesity Father    Breast cancer Neg Hx      Current Outpatient Medications:    Semaglutide (RYBELSUS) 7 MG TABS, Take 1 tablet by mouth the first thing in the morning with water without food, drink or other med., Disp: 30 tablet, Rfl: 2   buPROPion ER (WELLBUTRIN SR) 100 MG 12 hr tablet, Take 1 tablet (100 mg total) by mouth 2 (two) times daily.,  Disp: 60 tablet, Rfl: 0   meclizine (ANTIVERT) 25 MG tablet, Take 1 tablet (25 mg total) by mouth 3 (three) times daily as needed for dizziness. (Patient not taking: Reported on 07/18/2021), Disp: 30 tablet, Rfl: 0   ondansetron (ZOFRAN ODT) 4 MG disintegrating tablet, Take 1 tablet (4 mg total) by mouth every 8 (eight) hours as needed for nausea or vomiting. (Patient not taking: Reported on 07/18/2021), Disp: 20 tablet, Rfl: 0   Vitamin D, Ergocalciferol, (DRISDOL) 1.25 MG (50000 UNIT) CAPS capsule, Take 1 capsule (50,000 Units total) by mouth every 7 (seven) days., Disp: 12 capsule, Rfl: 0   Allergies  Allergen Reactions   Clindamycin/Lincomycin Shortness Of Breath   Sulfa Antibiotics Hives, Shortness Of Breath and Swelling     Review of Systems  Constitutional:  Negative for chills, fatigue and fever.  HENT:  Negative for congestion, postnasal drip, sinus pressure and sinus pain.   Respiratory:  Negative for cough, shortness of breath and wheezing.   Cardiovascular:  Negative for chest pain and palpitations.  Gastrointestinal:  Negative for diarrhea and nausea.  Endocrine: Negative for polydipsia, polyphagia and polyuria.  Neurological:  Negative for headaches.    Today's Vitals   07/18/21 1431  BP: 122/80  Pulse: 88  Temp: 97.8  F (36.6 C)  Weight: (!) 353 lb 3.2 oz (160.2 kg)  Height: 5' 4.4" (1.636 m)  PainSc: 0-No pain   Body mass index is 59.88 kg/m.  Wt Readings from Last 3 Encounters:  07/18/21 (!) 353 lb 3.2 oz (160.2 kg)  06/05/21 (!) 355 lb (161 kg)  05/02/21 (!) 330 lb 11 oz (150 kg)     Objective:  Physical Exam Constitutional:      Appearance: Normal appearance. She is obese.  Cardiovascular:     Rate and Rhythm: Normal rate and regular rhythm.     Pulses: Normal pulses.     Heart sounds: Normal heart sounds. No murmur heard. Pulmonary:     Effort: Pulmonary effort is normal. No respiratory distress.     Breath sounds: Normal breath sounds. No wheezing.   Skin:    General: Skin is warm and dry.     Capillary Refill: Capillary refill takes less than 2 seconds.  Neurological:     Mental Status: She is alert.        Assessment And Plan:     1. Prediabetes -Tolerating rybelsus 3 mg; will send prescription for 7 mg.  -Continue a healthy diabetic diet.  - Semaglutide (RYBELSUS) 7 MG TABS; Take 1 tablet by mouth the first thing in the morning with water without food, drink or other med.  Dispense: 30 tablet; Refill: 2  2. Current mild episode of major depressive disorder, unspecified whether recurrent (HCC) -Discontinued lexapro.  - buPROPion ER (WELLBUTRIN SR) 100 MG 12 hr tablet; Take 1 tablet (100 mg total) by mouth 2 (two) times daily.  Dispense: 60 tablet; Refill: 0  3. Vitamin D deficiency - Vitamin D, Ergocalciferol, (DRISDOL) 1.25 MG (50000 UNIT) CAPS capsule; Take 1 capsule (50,000 Units total) by mouth every 7 (seven) days.  Dispense: 12 capsule; Refill: 0  4. Immunization due - Flu Vaccine QUAD 6+ mos PF IM (Fluarix Quad PF)  5. Class 3 severe obesity due to excess calories with body mass index (BMI) of 50.0 to 59.9 in adult, unspecified whether serious comorbidity present (HCC)  -Advised patient on a healthy diet including avoiding fast food and red meats. Increase the intake of lean meats including grilled chicken and [place].  Drink a lot of water. Decrease intake of fatty foods. Exercise for 30-45 min. 4-5 a week to decrease the risk of cardiac event.   The patient was encouraged to call or send a message through MyChart for any questions or concerns.   Follow up: if symptoms persist or do not get better.   Patient was given opportunity to ask questions. Patient verbalized understanding of the plan and was able to repeat key elements of the plan. All questions were answered to their satisfaction.  Raman Ernestine Langworthy, DNP   I, Raman Orla Estrin have reviewed all documentation for this visit. The documentation on 07/18/21 for the  exam, diagnosis, procedures, and orders are all accurate and complete.   Side effects and appropriate use of all the medication(s) were discussed with the patient today. Patient advised to use the medication(s) as directed by their healthcare provider. The patient was encouraged to read, review, and understand all associated package inserts and contact our office with any questions or concerns. The patient accepts the risks of the treatment plan and had an opportunity to ask questions.    IF YOU HAVE BEEN REFERRED TO A SPECIALIST, IT MAY TAKE 1-2 WEEKS TO SCHEDULE/PROCESS THE REFERRAL. IF YOU HAVE NOT HEARD FROM US/SPECIALIST IN  TWO WEEKS, PLEASE GIVE [place] A CALL AT [phone].   THE PATIENT IS ENCOURAGED TO PRACTICE SOCIAL DISTANCING DUE TO THE COVID-19 PANDEMIC.

## 2021-07-24 ENCOUNTER — Other Ambulatory Visit: Payer: Self-pay | Admitting: Nurse Practitioner

## 2021-07-24 DIAGNOSIS — R7303 Prediabetes: Secondary | ICD-10-CM

## 2021-08-08 ENCOUNTER — Other Ambulatory Visit: Payer: Self-pay | Admitting: Nurse Practitioner

## 2021-08-08 ENCOUNTER — Other Ambulatory Visit: Payer: Self-pay

## 2021-08-08 ENCOUNTER — Encounter: Payer: Self-pay | Admitting: Nurse Practitioner

## 2021-08-08 DIAGNOSIS — R7303 Prediabetes: Secondary | ICD-10-CM

## 2021-08-08 MED ORDER — RYBELSUS 7 MG PO TABS: ORAL_TABLET | ORAL | 2 refills | Status: DC

## 2021-08-27 ENCOUNTER — Other Ambulatory Visit: Payer: Self-pay | Admitting: Obstetrics and Gynecology

## 2021-08-27 DIAGNOSIS — R928 Other abnormal and inconclusive findings on diagnostic imaging of breast: Secondary | ICD-10-CM

## 2021-09-12 ENCOUNTER — Ambulatory Visit
Admission: RE | Admit: 2021-09-12 | Discharge: 2021-09-12 | Disposition: A | Discharge status: Home / Self Care | Payer: BC Managed Care – PPO | Source: Ambulatory Visit | Attending: Obstetrics and Gynecology | Admitting: Obstetrics and Gynecology

## 2021-09-12 ENCOUNTER — Ambulatory Visit: Payer: BC Managed Care – PPO | Admitting: Nurse Practitioner

## 2021-09-12 ENCOUNTER — Encounter: Payer: Self-pay | Admitting: Nurse Practitioner

## 2021-09-12 ENCOUNTER — Other Ambulatory Visit: Payer: Self-pay

## 2021-09-12 VITALS — BP 110/62 | HR 82 | Temp 97.8°F | Ht 63.6 in | Wt 347.4 lb

## 2021-09-12 DIAGNOSIS — R928 Other abnormal and inconclusive findings on diagnostic imaging of breast: Secondary | ICD-10-CM

## 2021-09-12 DIAGNOSIS — R0981 Nasal congestion: Secondary | ICD-10-CM | POA: Diagnosis not present

## 2021-09-12 DIAGNOSIS — Z6841 Body Mass Index (BMI) 40.0 and over, adult: Secondary | ICD-10-CM

## 2021-09-12 DIAGNOSIS — R7303 Prediabetes: Secondary | ICD-10-CM

## 2021-09-12 MED ORDER — CETIRIZINE HCL 10 MG PO TABS: 10.0000 mg | ORAL_TABLET | Freq: Every day | ORAL | 2 refills | Status: DC

## 2021-09-12 MED ORDER — RYBELSUS 14 MG PO TABS: 1.0000 | ORAL_TABLET | Freq: Every day | ORAL | 1 refills | Status: DC

## 2021-09-12 NOTE — Patient Instructions (Signed)

## 2021-09-12 NOTE — Progress Notes (Signed)
Lucien Mons Llittleton,acting as a Neurosurgeon for Arnette Felts, FNP.,have documented all relevant documentation on the behalf of Arnette Felts, FNP,as directed by  Arnette Felts, FNP while in the presence of Arnette Felts, FNP.   This visit occurred during the SARS-CoV-2 public health emergency.  Safety protocols were in place, including screening questions prior to the visit, additional usage of staff PPE, and extensive cleaning of exam room while observing appropriate contact time as indicated for disinfecting solutions.  Subjective:     Patient ID: Stacy Thompson , female    DOB: 02/20/1976 , 46 y.o.   MRN: [id number]   Chief Complaint  Patient presents with   Weight Check    HPI  Patient presents today for prediabetes and weight check She is tolerated Rybelsus better than before. She was unable to tolerate metformin and was started on Rybelsus. She also retained increased water with the metformin as well had swelling to her feet.  She reports she has been having some congestion for the past 2 weeks. She has ordered a humdifier. She mostly has nasal congestion, now coughing. She has purchased dayquil about 4-5 days ago.  She has done a covid test and it was negative.   Wt Readings from Last 3 Encounters: 09/12/21 : (!) 347 lb 6.4 oz (157.6 kg) 07/18/21 : (!) 353 lb 3.2 oz (160.2 kg) 06/05/21 : (!) 355 lb (161 kg)      Past Medical History:  Diagnosis Date   Anxiety    Dizziness    Headache    Headache(784.0)    High blood pressure    Joint pain    Lactose intolerance    Left arm pain    Low back pain headach   Lower extremity edema    SOB (shortness of breath)    UTI (lower urinary tract infection)      Family History  Problem Relation Age of Onset   Depression Mother    Hypertension Father    Obesity Father    Breast cancer Neg Hx      Current Outpatient Medications:    buPROPion ER (WELLBUTRIN SR) 100 MG 12 hr tablet, Take 1 tablet (100 mg total) by mouth 2 (two)  times daily., Disp: 60 tablet, Rfl: 0   cetirizine (ZYRTEC ALLERGY) 10 MG tablet, Take 1 tablet (10 mg total) by mouth daily., Disp: 30 tablet, Rfl: 2   Semaglutide (RYBELSUS) 14 MG TABS, Take 1 tablet by mouth daily., Disp: 90 tablet, Rfl: 1   Vitamin D, Ergocalciferol, (DRISDOL) 1.25 MG (50000 UNIT) CAPS capsule, Take 1 capsule (50,000 Units total) by mouth every 7 (seven) days., Disp: 12 capsule, Rfl: 0   Allergies  Allergen Reactions   Clindamycin/Lincomycin Shortness Of Breath   Sulfa Antibiotics Hives, Shortness Of Breath and Swelling     Review of Systems  Constitutional: Negative.  Negative for fatigue.  HENT:  Positive for congestion (nasal congstion). Negative for sinus pressure and sinus pain.   Respiratory: Negative.    Cardiovascular: Negative.  Negative for chest pain and leg swelling.  Musculoskeletal: Negative.   Psychiatric/Behavioral: Negative.      Today's Vitals   09/12/21 1146  BP: 110/62  Pulse: 82  Temp: 97.8 F (36.6 C)  Weight: (!) 347 lb 6.4 oz (157.6 kg)  Height: 5' 3.6" (1.615 m)  PainSc: 0-No pain   Body mass index is 60.38 kg/m.   Objective:  Physical Exam Vitals reviewed.  Constitutional:      General: She is  not in acute distress.    Appearance: Normal appearance. She is well-developed. She is obese.  Cardiovascular:     Rate and Rhythm: Normal rate and regular rhythm.     Pulses: Normal pulses.     Heart sounds: Normal heart sounds. No murmur heard. Pulmonary:     Effort: Pulmonary effort is normal. No respiratory distress.     Breath sounds: Normal breath sounds.  Chest:     Chest wall: No tenderness.  Musculoskeletal:        General: Normal range of motion.  Skin:    General: Skin is warm and dry.     Capillary Refill: Capillary refill takes less than 2 seconds.  Neurological:     General: No focal deficit present.     Mental Status: She is alert and oriented to person, place, and time.     Cranial Nerves: No cranial nerve  deficit.  Psychiatric:        Mood and Affect: Mood normal.        Behavior: Behavior normal.        Thought Content: Thought content normal.        Judgment: Judgment normal.        Assessment And Plan:     1. Prediabetes Comments: Tolerating Rybelsus - Semaglutide (RYBELSUS) 14 MG TABS; Take 1 tablet by mouth daily.  Dispense: 90 tablet; Refill: 1  2. Class 3 severe obesity due to excess calories without serious comorbidity with body mass index (BMI) of 60.0 to 69.9 in adult [facility]) Comments: She has lost 6 lbs since her last visit  - Semaglutide (RYBELSUS) 14 MG TABS; Take 1 tablet by mouth daily.  Dispense: 90 tablet; Refill: 1  3. Nasal congestion Comments: She is encouraged to take antihistamine, may need to take at night may cause drowsiness. - cetirizine (ZYRTEC ALLERGY) 10 MG tablet; Take 1 tablet (10 mg total) by mouth daily.  Dispense: 30 tablet; Refill: 2     Patient was given opportunity to ask questions. Patient verbalized understanding of the plan and was able to repeat key elements of the plan. All questions were answered to their satisfaction.  Arnette Felts, FNP   I, Arnette Felts, FNP, have reviewed all documentation for this visit. The documentation on 09/12/21 for the exam, diagnosis, procedures, and orders are all accurate and complete.   IF YOU HAVE BEEN REFERRED TO A SPECIALIST, IT MAY TAKE 1-2 WEEKS TO SCHEDULE/PROCESS THE REFERRAL. IF YOU HAVE NOT HEARD FROM US/SPECIALIST IN TWO WEEKS, PLEASE GIVE [place] A CALL AT [phone].   THE PATIENT IS ENCOURAGED TO PRACTICE SOCIAL DISTANCING DUE TO THE COVID-19 PANDEMIC.

## 2021-09-28 ENCOUNTER — Other Ambulatory Visit: Payer: BC Managed Care – PPO

## 2021-10-14 ENCOUNTER — Encounter: Payer: Self-pay | Admitting: Nurse Practitioner

## 2021-10-16 ENCOUNTER — Encounter: Payer: BC Managed Care – PPO | Admitting: Nurse Practitioner

## 2022-02-22 ENCOUNTER — Encounter: Payer: Self-pay | Admitting: Nurse Practitioner

## 2022-03-06 ENCOUNTER — Encounter: Payer: BC Managed Care – PPO | Admitting: Nurse Practitioner

## 2022-03-20 ENCOUNTER — Encounter: Payer: Self-pay | Admitting: Nurse Practitioner

## 2022-03-21 ENCOUNTER — Encounter: Payer: BC Managed Care – PPO | Admitting: Nurse Practitioner

## 2022-03-27 ENCOUNTER — Encounter (INDEPENDENT_AMBULATORY_CARE_PROVIDER_SITE_OTHER): Payer: Self-pay

## 2022-03-29 ENCOUNTER — Ambulatory Visit: Payer: BC Managed Care – PPO | Admitting: Student

## 2022-03-29 ENCOUNTER — Encounter: Payer: Self-pay | Admitting: Student

## 2022-03-29 ENCOUNTER — Other Ambulatory Visit: Payer: Self-pay

## 2022-03-29 VITALS — BP 138/78 | HR 77 | Temp 98.3°F | Resp 28 | Ht 65.0 in | Wt 355.8 lb

## 2022-03-29 DIAGNOSIS — R7303 Prediabetes: Secondary | ICD-10-CM

## 2022-03-29 DIAGNOSIS — Z0000 Encounter for general adult medical examination without abnormal findings: Secondary | ICD-10-CM

## 2022-03-29 DIAGNOSIS — E559 Vitamin D deficiency, unspecified: Secondary | ICD-10-CM | POA: Diagnosis not present

## 2022-03-29 DIAGNOSIS — E7800 Pure hypercholesterolemia, unspecified: Secondary | ICD-10-CM

## 2022-03-29 DIAGNOSIS — F332 Major depressive disorder, recurrent severe without psychotic features: Secondary | ICD-10-CM | POA: Diagnosis not present

## 2022-03-29 DIAGNOSIS — Z1159 Encounter for screening for other viral diseases: Secondary | ICD-10-CM

## 2022-03-29 DIAGNOSIS — Z13220 Encounter for screening for lipoid disorders: Secondary | ICD-10-CM

## 2022-03-29 DIAGNOSIS — Z6841 Body Mass Index (BMI) 40.0 and over, adult: Secondary | ICD-10-CM

## 2022-03-29 LAB — POCT GLYCOSYLATED HEMOGLOBIN (HGB A1C): Hemoglobin A1C: 6.1 % — AB (ref 4.0–5.6)

## 2022-03-29 LAB — GLUCOSE, CAPILLARY: Glucose-Capillary: 90 mg/dL (ref 70–99)

## 2022-03-29 MED ORDER — SERTRALINE HCL 50 MG PO TABS: 50.0000 mg | ORAL_TABLET | Freq: Every day | ORAL | 2 refills | Status: DC

## 2022-03-29 MED ORDER — SEMAGLUTIDE(0.25 OR 0.5MG/DOS) 2 MG/3ML ~~LOC~~ SOPN: PEN_INJECTOR | SUBCUTANEOUS | 1 refills | Status: DC

## 2022-03-29 NOTE — Progress Notes (Unsigned)
   CC: To establish care  HPI:  [name] is a 46 y.o. female with PMH as below who presents to clinic today to establish care. Please see problem based charting for evaluation, assessment and plan.  Past Medical History:  Diagnosis Date   Anxiety    Dizziness    Headache    Headache(784.0)    High blood pressure    Joint pain    Lactose intolerance    Left arm pain    Low back pain headach   Lower extremity edema    SOB (shortness of breath)    UTI (lower urinary tract infection)    Allergies:   Social History:   Family History:   Review of Systems:  Constitutional: Negative for fever or fatigue Eyes: Negative for visual changes Respiratory: Negative for shortness of breath Cardiac: Negative for chest pain MSK: Negative for back pain Abdomen: Negative for abdominal pain, constipation or diarrhea Neuro: Negative for headache or weakness  Physical Exam: General: Pleasant *** No acute distress. Cardiac: RRR. No murmurs, rubs or gallops. No LE edema Respiratory: Lungs CTAB. No wheezing or crackles. Abdominal: Soft, symmetric and non tender. Normal BS. Skin: Warm, dry and intact without rashes or lesions Extremities: Atraumatic. Full ROM. Palpable radial and DP pulses. Neuro: A&O x 3. Moves all extremities Psych: Appropriate mood and affect.  Vitals:   03/29/22 0934  BP: 138/78  Pulse: 77  Resp: (!) 28  Temp: 98.3 F (36.8 C)  TempSrc: Oral  SpO2: 99%  Weight: (!) 355 lb 12.8 oz (161.4 kg)  Height: 5\' 5"  (1.651 m)     Assessment & Plan:   See Encounters Tab for problem based charting.  Patient {GC/GE:3044014::"discussed with","seen with"} Dr. {NAMES:[id number]::"Butcher","Guilloud","Hoffman","Mullen","Narendra","Raines","Vincent"}  , MD, MPH

## 2022-03-29 NOTE — Patient Instructions (Signed)
Thank you, Ms.Vandana A Rambert for allowing [place] to provide your care today. Today we discussed your depression, obesity, blood pressure and grief.    I have ordered the following labs for you:   Lab Orders         Glucose, capillary         Vitamin D (25 hydroxy)         Hepatitis C Ab reflex to Quant PCR         Lipid Profile         POC Hbg A1C      I will call if any are abnormal. All of your labs can be accessed through "My Chart".  I have place a referrals to psychiatry and free exercise program    I have ordered the following medication/changed the following medications:  Start Zoloft 50 mg daily Start ozempic 0.25 mg weekly x4 weeks then o.5 mg weekly  My Chart Access: [email or web address]  Please follow-up in 4 weeks  Please make sure to arrive 15 minutes prior to your next appointment. If you arrive late, you may be asked to reschedule.    We look forward to seeing you next time. Please call our clinic at [phone] if you have any questions or concerns. The best time to call is Monday-Friday from 9am-4pm, but there is someone available 24/7. If after hours or the weekend, call the main hospital number and ask for the Internal Medicine Resident On-Call. If you need medication refills, please notify your pharmacy one week in advance and they will send [place] a request.   Thank you for letting us take part in your care. Wishing you the best!  Steffanie Rainwater, MD 03/29/2022, 10:37 AM IM Resident, PGY-3 [place] Heck 41:10

## 2022-03-30 ENCOUNTER — Encounter: Payer: Self-pay | Admitting: Student

## 2022-03-30 DIAGNOSIS — E785 Hyperlipidemia, unspecified: Secondary | ICD-10-CM | POA: Insufficient documentation

## 2022-03-30 DIAGNOSIS — R7303 Prediabetes: Secondary | ICD-10-CM | POA: Insufficient documentation

## 2022-03-30 DIAGNOSIS — E559 Vitamin D deficiency, unspecified: Secondary | ICD-10-CM | POA: Insufficient documentation

## 2022-03-30 DIAGNOSIS — E66813 Obesity, class 3: Secondary | ICD-10-CM | POA: Insufficient documentation

## 2022-03-30 DIAGNOSIS — Z0000 Encounter for general adult medical examination without abnormal findings: Secondary | ICD-10-CM | POA: Insufficient documentation

## 2022-03-30 DIAGNOSIS — F32A Depression, unspecified: Secondary | ICD-10-CM | POA: Insufficient documentation

## 2022-03-30 LAB — LIPID PANEL
Chol/HDL Ratio: 3.4 ratio (ref 0.0–4.4)
Cholesterol, Total: 172 mg/dL (ref 100–199)
HDL: 50 mg/dL (ref >39)
LDL Chol Calc (NIH): 111 mg/dL — ABNORMAL HIGH (ref 0–99)
Triglycerides: 57 mg/dL (ref 0–149)
VLDL Cholesterol Cal: 11 mg/dL (ref 5–40)

## 2022-03-30 LAB — HCV INTERPRETATION

## 2022-03-30 LAB — VITAMIN D 25 HYDROXY (VIT D DEFICIENCY, FRACTURES): Vit D, 25-Hydroxy: 35.1 ng/mL (ref 30.0–100.0)

## 2022-03-30 LAB — HCV AB W REFLEX TO QUANT PCR: HCV Ab: NONREACTIVE

## 2022-03-30 NOTE — Assessment & Plan Note (Addendum)
Patient with a BMI of 59.2 and weight of 355 pounds today. Patient has gained about 20 pounds in the last 2 years but no significant change in weight since last year. Patient endorsed occasional fatigue and dyspnea on exertion likely secondary to obesity hypoventilation syndrome. Counseled patient on lifestyle changes with exercise and dietary changes to assist with weight loss. Patient will also benefit from initiating a GLP-1 with weight loss benefit.   Plan:  -Referral to PREP exercise program -Start Ozempic 0.25 mg weekly x4 weeks, then 0.5 mg weekly

## 2022-03-30 NOTE — Assessment & Plan Note (Signed)
Vitamin D level is improved from 18.6 nine months ago to 35.1 today. Will discontinue weekly vitamin supplements. -CTM

## 2022-03-30 NOTE — Assessment & Plan Note (Signed)
  A1c stable at 6.1% from 6.0% nine months ago.  Patient previously on Rybelsus however patient reports she did not tolerate the 7 mg dose.  In the setting of patient has class III obesity, GLP-1 will assist her with both weight loss and improvement in A1c. -Start Ozempic 0.25 mg weekly x4 weeks, then 0.5 mg weekly -Lifestyle modifications with exercise and dietary changes -Repeat A1c in 6 months

## 2022-03-30 NOTE — Assessment & Plan Note (Signed)
Hepatitis C screening negative today. Discussed plan for Pap smear at next office visit however patient states she thinks she has had it done earlier this year or late last year by her OB/GYN. -Discussed referral to GI for colonoscopy at next office visit

## 2022-03-30 NOTE — Assessment & Plan Note (Signed)
Lipid panel today shows total cholesterol 172, triglyceride 57, HDL 50 and LDL 111. Her ASCVD score is low at 2.1%, so no statin indicated at this time. Discussed with patient about lifestyle modifications with dietary changes and exercise. -Referral to exercise program (PREP) -Repeat lipid panel in 6 months

## 2022-03-30 NOTE — Assessment & Plan Note (Signed)
Patient presents to clinic today to establish care due to inability to get timely appointments at her previous provider. PHQ-9 score is elevated at 18 today and GAD-7 score low at 4. Patient states he has had family stressors have made her depressed over the last few years. States she lost her mom to a car accident in 2018 but does not think she had time to grieve her properly since everyone relied on her. States everyone comes to her with the problems but she does not have many family members besides her cousin to rely on. She feels like she wants to be able to have the mental space to focus on herself and not other family members but this has been difficult for her since her mom passed away. She reports previously having crying spells but that has improved over the last few months. She does endorse some difficulty sleeping with occasional fatigue and loss of interest in her activities. States sometimes she feels like "she is better off dead" but this is only a feeling she does not have any thoughts or plan of killing herself.  She is interested in starting medication for depression as well as therapy.  Plan: -Start sertraline 50 mg daily, titrate up if tolerated at next office visit in 4 weeks -Referral to behavioral health for CBT -Repeat PHQ-9 at next office visit

## 2022-04-01 ENCOUNTER — Encounter: Payer: Self-pay | Admitting: Student

## 2022-04-01 NOTE — Progress Notes (Signed)
Internal Medicine Clinic Attending  Case discussed with Dr. Kirke Corin  At the time of the visit.  We reviewed the resident's history and exam and pertinent patient test results.  I agree with the assessment, diagnosis, and plan of care documented in the resident's note.   Given low ASCVD risk and decision not to initiate statin - ok to repeat lipid panel in 3-5 years.

## 2022-04-05 ENCOUNTER — Telehealth: Payer: Self-pay

## 2022-04-05 NOTE — Telephone Encounter (Signed)
Call to pt reference PREP referral Explained program to pt.  Interested in participating.  Needs evening class, can come to [facility] Will  start 10/17 T/TH 6p-720p Will schedule time closer to start of class for intake

## 2022-04-26 ENCOUNTER — Other Ambulatory Visit: Payer: Self-pay

## 2022-04-26 ENCOUNTER — Ambulatory Visit: Payer: BC Managed Care – PPO | Admitting: Student

## 2022-04-26 ENCOUNTER — Encounter: Payer: Self-pay | Admitting: Student

## 2022-04-26 VITALS — BP 130/98 | HR 82 | Temp 98.3°F | Ht 65.0 in | Wt 349.7 lb

## 2022-04-26 DIAGNOSIS — F332 Major depressive disorder, recurrent severe without psychotic features: Secondary | ICD-10-CM | POA: Diagnosis not present

## 2022-04-26 MED ORDER — FLUOXETINE HCL 10 MG PO CAPS: 10.0000 mg | ORAL_CAPSULE | Freq: Every day | ORAL | 0 refills | Status: DC

## 2022-04-26 NOTE — Patient Instructions (Signed)
[name], it was a pleasure seeing you today!  Today we discussed: - Depression: We are going to switch Zoloft to Prozac. I have started you on the lowest dose, 10mg  daily. We will see you back in a month to see how you are doing.   I have ordered the following medication/changed the following medications:   Start the following medications: Meds ordered this encounter  Medications   FLUoxetine (PROZAC) 10 MG capsule    Sig: Take 1 capsule (10 mg total) by mouth daily.    Dispense:  30 capsule    Refill:  0     Follow-up:  1 month    Please make sure to arrive 15 minutes prior to your next appointment. If you arrive late, you may be asked to reschedule.   We look forward to seeing you next time. Please call our clinic at [phone] if you have any questions or concerns. The best time to call is Monday-Friday from 9am-4pm, but there is someone available 24/7. If after hours or the weekend, call the main hospital number and ask for the Internal Medicine Resident On-Call. If you need medication refills, please notify your pharmacy one week in advance and they will send [phone] a request.  Thank you for letting us take part in your care. Wishing you the best!  Thank you, [place], MD

## 2022-04-26 NOTE — Progress Notes (Signed)
   CC: follow-up for new medication  HPI:  Stacy Thompson is a 46 y.o. person with depression, prediabetes, class III obesity, hyperlipidemia presenting to [facility] for follow-up after starting SSRI last month.   Please see problem-based list for further details, assessments, and plans.  Past Medical History:  Diagnosis Date   Anxiety    Dizziness    Headache    Headache(784.0)    High blood pressure    Joint pain    Lactose intolerance    Left arm pain    Low back pain headach   Lower extremity edema    SOB (shortness of breath)    UTI (lower urinary tract infection)    Review of Systems:  As per HPI  Physical Exam:  Vitals:   04/26/22 1116 04/26/22 1140  BP: (!) 141/82 (!) 130/98  Pulse: 89 82  Temp: 98.3 F (36.8 C)   TempSrc: Oral   SpO2: 99%   Weight: (!) 349 lb 11.2 oz (158.6 kg)   Height: 5\' 5"  (1.651 m)    General: Resting comfortably in chair, no acute distress. CV: Regular rate, rhythm. No murmurs appreciated.  Pulm: Normal work of breathing on room air. Clear to auscultation bilaterally. Skin: Warm, dry. No rashes or lesions appreciated.  Neuro: Awake, alert, conversing appropriately. Grossly non-focal. Psych: Normal mood, affect, speech.   Assessment & Plan:   Depression Stacy Thompson is presenting today after establishing care with Dr. Hilda Blades here one month ago. At that time, she was started on sertraline for depression. Today she reports that the initial dosage of 50mg  made her feel very groggy and fatigued. After taking this for a few days, she started taking half of the dosage. Unfortunately, she has not noticed much of a difference since before starting sertraline. Mentions that she feels as if the medication isn't really doing anything for her. She does not have any current suicidal or homicidal ideation at this time. It does not appear that she has been able to follow-up with [facility] yet either.   Ms. Lafuente and I discussed different possible  options, including switching to a different SSRI. She does recall previously being on escitalopram and reports this also did not work well. Mentions she has difficulties with tolerating medications, would like to start at lowest possible dose.   - STOP sertraline - Start fluoxetine 10mg  daily - Return to clinic in one month - Follow-up with behavioral health   Patient discussed with Dr. [facility], MD Internal Medicine PGY-3 Pager: [phone]

## 2022-04-29 NOTE — Assessment & Plan Note (Signed)
Stacy Thompson is presenting today after establishing care with Dr. Kirke Corin here one month ago. At that time, she was started on sertraline for depression. Today she reports that the initial dosage of 50mg  made her feel very groggy and fatigued. After taking this for a few days, she started taking half of the dosage. Unfortunately, she has not noticed much of a difference since before starting sertraline. Mentions that she feels as if the medication isn't really doing anything for her. She does not have any current suicidal or homicidal ideation at this time. It does not appear that she has been able to follow-up with [facility] yet either.   Ms. Cando and I discussed different possible options, including switching to a different SSRI. She does recall previously being on escitalopram and reports this also did not work well. Mentions she has difficulties with tolerating medications, would like to start at lowest possible dose.   - STOP sertraline - Start fluoxetine 10mg  daily - Return to clinic in one month - Follow-up with behavioral health

## 2022-04-30 NOTE — Progress Notes (Signed)
Internal Medicine Clinic Attending  Case discussed with the resident at the time of the visit.  We reviewed the resident's history and exam and pertinent patient test results.  I agree with the assessment, diagnosis, and plan of care documented in the resident's note.  

## 2022-05-09 ENCOUNTER — Encounter (HOSPITAL_COMMUNITY): Payer: Self-pay

## 2022-05-09 ENCOUNTER — Ambulatory Visit (INDEPENDENT_AMBULATORY_CARE_PROVIDER_SITE_OTHER): Payer: BC Managed Care – PPO | Admitting: Licensed Clinical Social Worker

## 2022-05-09 DIAGNOSIS — Z634 Disappearance and death of family member: Secondary | ICD-10-CM

## 2022-05-09 DIAGNOSIS — F411 Generalized anxiety disorder: Secondary | ICD-10-CM | POA: Diagnosis not present

## 2022-05-09 DIAGNOSIS — F331 Major depressive disorder, recurrent, moderate: Secondary | ICD-10-CM | POA: Diagnosis not present

## 2022-05-09 NOTE — Plan of Care (Signed)
  Problem: Depression Goal:  Decrease depressive symptoms and improve levels of effective functioning-pt reports a decrease in overall depression symptoms 3 out of 5 sessions documented.  Outcome: Initial Goal: Develop healthy thinking patterns and beliefs about self, others, and the world that lead to the alleviation and help prevent the relapse of depression per self report 3 out of 5 sessions documented.   Outcome: Initial   Problem: Anxiety  Goal:  Reduce overall frequency, intensity, and duration of the anxiety so that daily functioning is not impaired per pt self report 3 out of 5 sessions documented.   Outcome: Initial Goal: Learn and implement coping skills that result in a reduction of anxiety and worry, and improve daily functioning per pt report 3 out of 5 sessions documented  Outcome: Initial   Developed/revised tx plan based on pt self reported input. Pt verbally agrees with treatment plan at time of session     

## 2022-05-09 NOTE — Progress Notes (Unsigned)
Virtual Visit via Audio Note  I connected with Stacy Thompson on 05/09/22 at  9:00 AM EDT by an audio enabled telemedicine application and verified that I am speaking with the correct person using two identifiers.  Location: Patient: home Provider: [facility] [place] [facility]   I discussed the limitations of evaluation and management by telemedicine and the availability of in person appointments. The patient expressed understanding and agreed to proceed.   I discussed the assessment and treatment plan with the patient. The patient was provided an opportunity to ask questions and all were answered. The patient agreed with the plan and demonstrated an understanding of the instructions.   The patient was advised to call back or seek an in-person evaluation if the symptoms worsen or if the condition fails to improve as anticipated.  I provided 45 minutes of non-face-to-face time during this encounter.   Stacy HaberChristina R Hudson Lehmkuhl, LCSW Comprehensive Clinical Assessment (CCA) Note  05/09/2022 Stacy Thompson [id number]  Chief Complaint:  Chief Complaint  Patient presents with   Establish Care   Visit Diagnosis:  Encounter Diagnoses  Name Primary?   MDD (major depressive disorder), recurrent episode, moderate (HCC) Yes   Generalized anxiety disorder    Bereavement     CCA Screening, Triage and Referral (STR)  Patient Reported Information How did you hear about us? Primary Care  Referral name: PCP  Referral phone number: No data recorded  Whom do you see for routine medical problems? Primary Care  Practice/Facility Name: No data recorded Practice/Facility Phone Number: No data recorded Name of Contact: No data recorded Contact Number: No data recorded Contact Fax Number: No data recorded Prescriber Name: No data recorded Prescriber Address (if known): No data recorded  What Is the Reason for Your Visit/Call Today? Stacy Thompson is a 46 year old female  [facility] establishment of outpatient psychotherapy services. Patient reports that she is under the psychiatric medication management of her primary care physician, and is currently taking Prozac to manage symptoms of depression. Patient denies having a psychiatrist in the past, and reports that she has had grief counseling after she lost her mother in 2018. Patient reports that she cried every day for two years after she lost her mother. Patient denies any history of inpatient psychiatric hospitalizations. Patient reports that she experiences passive suicidal ideation (no plans or intent to follow through), no homicidal ideation, and is not experiencing any perceptual disturbances. Patient reports but she has used THC in the past but nothing currently, and that she is a social drinker of alcohol. Patient denies any additional substance use. Patient reports that she currently resides with multiple family members: her father, brother, sister, and another sister with her three children. Patient reports that there is some family conflict at times. Patient reports that relationship with her oldest sister is also a trigger of stress. Patient reports that she is currently taking ozempic, and has lost seven pounds. Patient denies any current complex medical concerns, and admits that she has some chronic pain in her knee. Patient reports that she would like to develop coping skills for managing depression symptoms common she would like to increase physical activity, and manage stress and anxiety better.  How Long Has This Been Causing You Problems? > than 6 months  What Do You Feel Would Help You the Most Today? Treatment for Depression or other mood problem   Have You Recently Been in Any Inpatient Treatment (Hospital/Detox/Crisis Center/28-Day Program)? No  Name/Location of Program/Hospital:No  data recorded How Long Were You There? No data recorded When Were You Discharged? No data  recorded  Have You Ever Received Services From [facility] Before? Yes  Who Do You See at [facility]? No data recorded  Have You Recently Had Any Thoughts About Hurting Yourself? No  Are You Planning to Commit Suicide/Harm Yourself At This time? No   Have you Recently Had Thoughts About Hurting Someone Stacy Thompson? No  Explanation: No data recorded  Have You Used Any Alcohol or Drugs in the Past 24 Hours? No  How Long Ago Did You Use Drugs or Alcohol? No data recorded What Did You Use and How Much? No data recorded  Do You Currently Have a Therapist/Psychiatrist? No  Name of Therapist/Psychiatrist: No data recorded  Have You Been Recently Discharged From Any Office Practice or Programs? No  Explanation of Discharge From Practice/Program: No data recorded    CCA Screening Triage Referral Assessment Type of Contact: Tele-Assessment  Is this Initial or Reassessment? Initial Assessment  Date Telepsych consult ordered in [facility]:  No data recorded Time Telepsych consult ordered in [facility]:  No data recorded  Patient Reported Information Reviewed? No data recorded Patient Left Without Being Seen? No data recorded Reason for Not Completing Assessment: No data recorded  Collateral Involvement: EPIC review   Does Patient Have a Court Appointed Legal Guardian? No data recorded Name and Contact of Legal Guardian: No data recorded If Minor and Not Living with Parent(s), Who has Custody? No data recorded Is CPS involved or ever been involved? Never  Is APS involved or ever been involved? Never   Patient Determined To Be At Risk for Harm To Self or Others Based on Review of Patient Reported Information or Presenting Complaint? No  Method: No data recorded Availability of Means: No data recorded Intent: No data recorded Notification Required: No data recorded Additional Information for Danger to Others Potential: No data recorded Additional Comments for Danger to Others Potential: No  data recorded Are There Guns or Other Weapons in Your Home? No data recorded Types of Guns/Weapons: No data recorded Are These Weapons Safely Secured?                            No data recorded Who Could Verify You Are Able To Have These Secured: No data recorded Do You Have any Outstanding Charges, Pending Court Dates, Parole/Probation? No data recorded Contacted To Inform of Risk of Harm To Self or Others: No data recorded  Location of Assessment: Other (comment)   Does Patient Present under Involuntary Commitment? No  IVC Papers Initial File Date: No data recorded  [place] of Residence: [place]   Patient Currently Receiving the Following Services: Medication Management   Determination of Need: Routine (7 days)   Options For Referral: Medication Management; Outpatient Therapy     CCA Biopsychosocial Intake/Chief Complaint:  depression; grief  Current Symptoms/Problems: depression; grief   Patient Reported Schizophrenia/Schizoaffective Diagnosis in Past: No   Strengths: good levels of self awareness  Preferences: outpaient psychiatric supports  Abilities: ability to commit to treatment plan   Type of Services Patient Feels are Needed: medication management; psychotherapy   Initial Clinical Notes/Concerns: No data recorded  Mental Health Symptoms Depression:  Change in energy/activity; Sleep (too much or little); Difficulty Concentrating; Fatigue; Hopelessness; Irritability; Weight gain/loss; Worthlessness; Tearfulness; Increase/decrease in appetite   Duration of Depressive symptoms: Greater than two weeks   Mania:  Racing thoughts  Anxiety:   Worrying; Sleep; Restlessness; Irritability; Fatigue; Difficulty concentrating   Psychosis:  None   Duration of Psychotic symptoms: No data recorded  Trauma:  Re-experience of traumatic event; Avoids reminders of event; Detachment from others; Emotional numbing; Irritability/anger; Guilt/shame; Difficulty  staying/falling asleep (nightmares and flashbacks)   Obsessions:  Recurrent & persistent thoughts/impulses/images (intrusive negative thoughts about self)   Compulsions:  No data recorded  Inattention:  Avoids/dislikes activities that require focus; Poor follow-through on tasks; Forgetful; Symptoms present in 2 or more settings   Hyperactivity/Impulsivity:  No data recorded  Oppositional/Defiant Behaviors:  Angry ("sometimes i feel like i go in a rage--it takes forever for me to calm down".  Typically its my family that triggers when I'm trying to prove a point or something to them.)   Emotional Irregularity:  Mood lability   Other Mood/Personality Symptoms:  No data recorded   Mental Status Exam Appearance and self-care  Stature:  Average   Weight:  Average weight   Clothing:  Neat/clean   Grooming:  Normal   Cosmetic use:  Age appropriate   Posture/gait:  Normal   Motor activity:  Not Remarkable   Sensorium  Attention:  Normal   Concentration:  Normal   Orientation:  X5   Recall/memory:  Normal   Affect and Mood  Affect:  Appropriate   Mood:  Anxious; Depressed   Relating  Eye contact:  Normal   Facial expression:  Anxious; Depressed   Attitude toward examiner:  Cooperative   Thought and Language  Speech flow: Clear and Coherent   Thought content:  Appropriate to Mood and Circumstances   Preoccupation:  None   Hallucinations:  None   Organization:  No data recorded  [employer] of Knowledge:  Good   Intelligence:  Above Average   Abstraction:  Normal   Judgement:  Good   Reality Testing:  Realistic   Insight:  Good   Decision Making:  Normal   Social Functioning  Social Maturity:  Responsible   Social Judgement:  Normal   Stress  Stressors:  Family conflict; Work   Coping Ability:  Programme researcher, broadcasting/film/video Deficits:  None   Supports:  Family ("my cousin")     Religion: Religion/Spirituality Are You A Religious  Person?: Yes ("i am attending church some--i went last week")  Leisure/Recreation: Leisure / Recreation Do You Have Hobbies?: Yes Leisure and Hobbies: "reading"  Exercise/Diet: Exercise/Diet Do You Exercise?: No ("i want to be more active") Have You Gained or Lost A Significant Amount of Weight in the Past Six Months?: Yes-Lost Number of Pounds Lost?: 7 (taking ozympic) Do You Follow a Special Diet?: No Do You Have Any Trouble Sleeping?: Yes Explanation of Sleeping Difficulties: frequent waking around 2-3am every night and hard to fall back to sleep   CCA Employment/Education Employment/Work Situation: Employment / Work Situation Employment Situation: Employed Where is Patient Currently Employed?: [place] Satisfied With Your Job?: Yes Patient's Job has Been Impacted by Current Illness: No Has Patient ever Been in the [employer]?: No  Education: Education Is Patient Currently Attending School?: No Last Grade Completed: 12 Did Teacher, adult education From [employer]?: Yes Did Physicist, medical?: Yes What Type of College Degree Do you Have?: Bachelor Social Work Did Heritage manager?: No What Was Your Major?: Social Work Did You Have An Individualized Education Program (IIEP): No Did You Have Any Difficulty At [employer]?: No Patient's Education Has Been Impacted by Current Illness:  No   CCA Family/Childhood History Family and Relationship History: Family history Does patient have children?: No  Childhood History:  Childhood History By whom was/is the patient raised?: Both parents Additional childhood history information: pt reports stable home life with both parents and siblings. pt reports very close with mother and she Description of patient's relationship with caregiver when they were a child: stable Does patient have siblings?: Yes Number of Siblings: 4 Description of patient's current relationship with siblings: pt reports that  she resides still with some of her siblings. Pt reports brother with Down Syndrome Did patient suffer any verbal/emotional/physical/sexual abuse as a child?: No Did patient suffer from severe childhood neglect?: No Has patient ever been sexually abused/assaulted/raped as an adolescent or adult?: No Was the patient ever a victim of a crime or a disaster?: No Witnessed domestic violence?: No Has patient been affected by domestic violence as an adult?: No  Child/Adolescent Assessment:     CCA Substance Use Alcohol/Drug Use: Alcohol / Drug Use Pain Medications: SEE MAR Prescriptions: SEE MAR Over the Counter: SEE MAR History of alcohol / drug use?: Yes Longest period of sobriety (when/how long): N/A Negative Consequences of Use:  (NONE) Withdrawal Symptoms: None Substance #1 Name of Substance 1: CURRENT: ETOH SOCIAL 1 - Frequency: SOCIALLY 1 - Method of Aquiring: LEGAL 1- Route of Use: ORAL DRINK  ASAM's:  Six Dimensions of Multidimensional Assessment  Dimension 1:  Acute Intoxication and/or Withdrawal Potential:   Dimension 1:  Description of individual's past and current experiences of substance use and withdrawal: PT REPORTS CURRENT SOCIAL ETOH USE  Dimension 2:  Biomedical Conditions and Complications:      Dimension 3:  Emotional, Behavioral, or Cognitive Conditions and Complications:     Dimension 4:  Readiness to Change:     Dimension 5:  Relapse, Continued use, or Continued Problem Potential:     Dimension 6:  Recovery/Living Environment:     ASAM Severity Score: ASAM's Severity Rating Score: 0  ASAM Recommended Level of Treatment: ASAM Recommended Level of Treatment: Level I Outpatient Treatment   Substance use Disorder (SUD) Substance Use Disorder (SUD)  Checklist Symptoms of Substance Use:  (NONE)  Recommendations for Services/Supports/Treatments: Recommendations for Services/Supports/Treatments Recommendations For Services/Supports/Treatments: Individual  Therapy, Medication Management  DSM5 Diagnoses: Patient Active Problem List   Diagnosis Date Noted   Class 3 severe obesity in adult [facility]) 03/30/2022   Prediabetes 03/30/2022   Vitamin D deficiency 03/30/2022   Hyperlipidemia 03/30/2022   Depression 03/30/2022   Healthcare maintenance 03/30/2022   Hiatal hernia 05/02/2021   Diverticulosis 05/02/2021    Patient Centered Plan: Patient is on the following Treatment Plan(s):  Anxiety and Depression   Referrals to Alternative Service(s): Referred to Alternative Service(s):   Place:   Date:   Time:    Referred to Alternative Service(s):   Place:   Date:   Time:    Referred to Alternative Service(s):   Place:   Date:   Time:    Referred to Alternative Service(s):   Place:   Date:   Time:      Collaboration of Care: Other n/a at time of session  Patient/Guardian was advised Release of Information must be obtained prior to any record release in order to collaborate their care with an outside provider. Patient/Guardian was advised if they have not already done so to contact the registration department to sign all necessary forms in order for [place] to release information regarding their care.   Consent: Patient/Guardian  gives verbal consent for treatment and assignment of benefits for services provided during this visit. Patient/Guardian expressed understanding and agreed to proceed.   Bandon Sherwin R Lamari Beckles, LCSW

## 2022-05-16 ENCOUNTER — Encounter: Payer: Self-pay | Admitting: Family Medicine

## 2022-05-16 ENCOUNTER — Ambulatory Visit (INDEPENDENT_AMBULATORY_CARE_PROVIDER_SITE_OTHER): Payer: BC Managed Care – PPO | Admitting: Family Medicine

## 2022-05-16 ENCOUNTER — Other Ambulatory Visit: Payer: Self-pay | Admitting: Student

## 2022-05-16 DIAGNOSIS — R519 Headache, unspecified: Secondary | ICD-10-CM | POA: Insufficient documentation

## 2022-05-16 DIAGNOSIS — F332 Major depressive disorder, recurrent severe without psychotic features: Secondary | ICD-10-CM

## 2022-05-16 DIAGNOSIS — R7303 Prediabetes: Secondary | ICD-10-CM

## 2022-05-16 MED ORDER — SEMAGLUTIDE (1 MG/DOSE) 4 MG/3ML ~~LOC~~ SOPN: 1.0000 mg | PEN_INJECTOR | SUBCUTANEOUS | 0 refills | Status: AC

## 2022-05-16 NOTE — Progress Notes (Signed)
  [facility] Telephone Encounter Continuity Care Appointment  HPI:  This telephone encounter was created for [name] on 05/16/2022 for the following purpose/cc Depression and prediabetes. Pt also wanted to address headache which is going on for few years.    Past Medical History:  Past Medical History:  Diagnosis Date   Anxiety    Dizziness    Headache    Headache(784.0)    High blood pressure    Joint pain    Lactose intolerance    Left arm pain    Low back pain headach   Lower extremity edema    SOB (shortness of breath)    UTI (lower urinary tract infection)      ROS:     Assessment / Plan / Recommendations:  Please see A&P under problem oriented charting for assessment of the patient's acute and chronic medical conditions.  As always, pt is advised that if symptoms worsen or new symptoms arise, they should go to an urgent care facility or to to ER for further evaluation.   Consent and Medical Decision Making:  Patient seen with Dr.  Saverio Danker This is a telephone encounter between [name] and Stacy Thompson on 05/16/2022 for follow up on the change in medication and the response to the prescribed medication. The visit was conducted with the patient located at home and Stacy Thompson at [facility]. The patient's identity was confirmed using their DOB and current address. The patient has consented to being evaluated through a telephone encounter and understands the associated risks (an examination cannot be done and the patient may need to come in for an appointment) / benefits (allows the patient to remain at home, decreasing exposure to coronavirus). I personally spent 25 minutes on medical discussion.  Depression: Patient was started on fluoxetine 10 mg.  Patient responding well to the prescribed medication.  She reports she feels energetic and able to perform her job duties better.  Her mood is also better throughout the day.  She  denies feeling depressed.  She also denied SI/HI.  Patient did 1 visit with behavioral health on September 21.  Her follow-up visit with behavioral health is on November 2.  Plan is to continue fluoxetine at 10 mg.  We will follow-up with the patient in 4 weeks. Prediabetes: Patient is using Ozempic as prescribed.  Patient had a question about the dosage.  Patient was informed that she should be taking 0.5 mg for 4 weeks.  I will also send another prescription for semaglutide injection 1 mg which she will use once a week for 4 weeks. Pt was informed on the dosage change. Pt verbalizes understanding. Patient also reported headache which wakes her up in the middle of the night.  Headache is throbbing in nature, associated with nausea and vomiting.  She denies visual changes.  It happens once or twice a month and lasts for hours.  Patient takes extra strength Excedrin and Tylenol with no relief in headache.  The headache spontaneously resolves as the day progresses.  This is not a new headache or not the worst headache of her life.  Patient has seen a neurologist in the past.  No work-up or medication were prescribed by the neurologist.  We will evaluate the patient for headache on the follow-up visit in 4 weeks.

## 2022-05-16 NOTE — Assessment & Plan Note (Signed)
Patient will continue semaglutide 0.5 mg once a week for 4 weeks.  I will send another prescription of semaglutide 1 mg once a week for 4 weeks.

## 2022-05-16 NOTE — Assessment & Plan Note (Signed)
Patient's symptoms are well controlled with fluoxetine.  We will continue the fluoxetine and follow-up in 4 weeks.

## 2022-05-16 NOTE — Patient Instructions (Signed)
Patient will continue sertraline 10 mg once a day. Patient will also continue semaglutide 0.5 mg once a week injection for 2 weeks and then 1 mg once a week for 4 weeks. We will follow-up with the patient in 4 weeks for reevaluation of depression, prediabetes and headache.

## 2022-05-21 NOTE — Addendum Note (Signed)
Addended by: Charise Killian on: 05/21/2022 09:43 AM   Modules accepted: Level of Service

## 2022-05-21 NOTE — Progress Notes (Signed)
Internal Medicine Clinic Attending  I spoke with the patient over the telephone following resident evaluation. I personally confirmed the key portions of the history and exam documented by Dr. Jeanett Schlein and I reviewed pertinent patient test results.  The assessment, diagnosis, and plan were formulated together and I agree with the documentation in the resident's note.

## 2022-05-24 ENCOUNTER — Telehealth: Payer: Self-pay

## 2022-05-24 ENCOUNTER — Telehealth: Payer: BC Managed Care – PPO | Admitting: Internal Medicine

## 2022-05-24 NOTE — Telephone Encounter (Signed)
Call to pt to confirm PREP 10/17 6p-715p Intake scheduled for 10/12 at 530p at [facility]. Will meet her at front desk.

## 2022-05-30 NOTE — Progress Notes (Signed)
[facility] PRE[facility] Evaluation  Patient Details  Name: Stacy Thompson MRN: [id number] Date of Birth: [birth year] Age: 46 y.o. PCP: Lacinda Axon, MD  Vitals:   05/30/22 1802  BP: 130/80  Pulse: 85  SpO2: 99%  Weight: (!) 349 lb (158.3 kg)     [facility] Eval - 05/30/22 1800       [facility] "PREP" Location   [facility] "PRE[facility]" Location [facility]      Referral    Referring Provider Hoffman    Reason for referral --   PRE-diabetes, weight loss   Program Start Date 06/04/22   T/TH 6p-715p x 12 wks     Measurement   Waist Circumference 54 inches    Hip Circumference 56 inches    Body fat --   too high to read     Information for Trainer   Goals Lose 35 lbs, feel better    Current Exercise none    Orthopedic Concerns bil knee pain    Pertinent Medical History PRE diabetes    Current Barriers none    Medications that affect exercise Medication causing dizziness/drowsiness      Timed Up and Go (TUGS)   Timed Up and Go Low risk <9 seconds      Mobility and Daily Activities   I have no trouble taking out the trash. 4    I do housework such as vacuuming and dusting on my own without difficulty. 4    I can easily lift a gallon of milk (8lbs). 4    I can easily walk a mile. 1    I have no trouble reaching into high cupboards or reaching down to pick up something from the floor. 4    I do not have trouble doing out-door work such as Armed forces logistics/support/administrative officer, raking leaves, or gardening. 4      Mobility and Daily Activities   I feel younger than my age. 1    I feel independent. 2    I feel energetic. 1    I live an active life.  1    I feel strong. 1    I feel healthy. 1    I feel active as other people my age. 1      How fit and strong are you.   Fit and Strong Total Score 29            Past Medical History:  Diagnosis Date   Anxiety    Dizziness    Headache    Headache(784.0)    High blood pressure    Joint pain    Lactose intolerance    Left arm pain    Low back pain  headach   Lower extremity edema    SOB (shortness of breath)    UTI (lower urinary tract infection)    Past Surgical History:  Procedure Laterality Date   TONSILLECTOMY     WISDOM TOOTH EXTRACTION     Social History   Tobacco Use  Smoking Status Never  Smokeless Tobacco Never    Barnett Hatter 05/30/2022, 6:05 PM

## 2022-06-11 ENCOUNTER — Encounter: Payer: BC Managed Care – PPO | Admitting: Student

## 2022-06-14 NOTE — Progress Notes (Signed)
[facility] PRE[facility] Weekly Session  Patient Details  Name: BRIDGITT RAGGIO MRN: [id number] Date of Birth: 1975-10-04 Age: 46 y.o. PCP: Lacinda Axon, MD  Vitals:   06/11/22 1800  Weight: (!) 346 lb 6.4 oz (157.1 kg)     [facility] Weekly seesion - 06/14/22 1400       [facility] "PREP" Location   [facility] "PREP" Location [facility]      Weekly Session   Topic Discussed Importance of resistance training;Other ways to be active    Minutes exercised this week 65 minutes    Classes attended to date Brandon 06/14/2022, 2:01 PM

## 2022-06-20 ENCOUNTER — Other Ambulatory Visit: Payer: Self-pay

## 2022-06-20 ENCOUNTER — Encounter: Payer: Self-pay | Admitting: Student

## 2022-06-20 ENCOUNTER — Ambulatory Visit: Payer: BC Managed Care – PPO | Admitting: Student

## 2022-06-20 ENCOUNTER — Ambulatory Visit (INDEPENDENT_AMBULATORY_CARE_PROVIDER_SITE_OTHER): Payer: BC Managed Care – PPO | Admitting: Licensed Clinical Social Worker

## 2022-06-20 VITALS — BP 132/87 | HR 88 | Temp 98.0°F | Ht 65.0 in | Wt 345.8 lb

## 2022-06-20 DIAGNOSIS — F411 Generalized anxiety disorder: Secondary | ICD-10-CM | POA: Diagnosis not present

## 2022-06-20 DIAGNOSIS — F331 Major depressive disorder, recurrent, moderate: Secondary | ICD-10-CM

## 2022-06-20 DIAGNOSIS — R7303 Prediabetes: Secondary | ICD-10-CM | POA: Diagnosis not present

## 2022-06-20 DIAGNOSIS — Z23 Encounter for immunization: Secondary | ICD-10-CM | POA: Diagnosis not present

## 2022-06-20 DIAGNOSIS — G43009 Migraine without aura, not intractable, without status migrainosus: Secondary | ICD-10-CM

## 2022-06-20 DIAGNOSIS — Z6841 Body Mass Index (BMI) 40.0 and over, adult: Secondary | ICD-10-CM | POA: Diagnosis not present

## 2022-06-20 DIAGNOSIS — Z0000 Encounter for general adult medical examination without abnormal findings: Secondary | ICD-10-CM

## 2022-06-20 MED ORDER — SUMATRIPTAN SUCCINATE 25 MG PO TABS: 25.0000 mg | ORAL_TABLET | ORAL | 0 refills | Status: AC | PRN

## 2022-06-20 NOTE — Plan of Care (Signed)
  Problem: Depression  Goal: Decrease depressive symptoms and improve levels of effective functioning-pt reports a decrease in overall depression symptoms 3 out of 5 sessions documented.  Outcome: Progressing Goal: Develop healthy thinking patterns and beliefs about self, others, and the world that lead to the alleviation and help prevent the relapse of depression per self report 3 out of 5 sessions documented.   Outcome: Progressing Intervention: Assist with coping skills and behavior Note: Reviewed  Intervention: Encourage self-care activities Note: Encouraged  Intervention: Encourage verbalization of feelings/concerns/expectations Note: Allowed pt to explore/express   Problem: Anxiety  Goal: Reduce overall frequency, intensity, and duration of the anxiety so that daily functioning is not impaired per pt self report 3 out of 5 sessions documented.   Outcome: Progressing Goal: Learn and implement coping skills that result in a reduction of anxiety and worry, and improve daily functioning per pt report 3 out of 5 sessions documented  Outcome: Progressing

## 2022-06-20 NOTE — Patient Instructions (Signed)
Stacy Thompson, it was a pleasure seeing you today!  Today we discussed: - Migraines: I have prescribed sumatriptan 25mg  for your headaches. Please return to clinic in one month to see how you are doing with this medication.   - I have placed a referral for the nutritionist.   I have ordered the following medication/changed the following medications:   Start the following medications: Meds ordered this encounter  Medications   SUMAtriptan (IMITREX) 25 MG tablet    Sig: Take 1 tablet (25 mg total) by mouth every 2 (two) hours as needed for migraine. May repeat in 2 hours if headache persists or recurs.    Dispense:  5 tablet    Refill:  0     Follow-up:  1 month    Please make sure to arrive 15 minutes prior to your next appointment. If you arrive late, you may be asked to reschedule.   We look forward to seeing you next time. Please call our clinic at [phone] if you have any questions or concerns. The best time to call is Monday-Friday from 9am-4pm, but there is someone available 24/7. If after hours or the weekend, call the main hospital number and ask for the Internal Medicine Resident On-Call. If you need medication refills, please notify your pharmacy one week in advance and they will send [place] a request.  Thank you for letting us take part in your care. Wishing you the best!  Thank you, Sanjuan Dame, MD

## 2022-06-20 NOTE — Progress Notes (Signed)
   THERAPIST PROGRESS NOTE  Session Time: [place] in office visit for patient and LCSW clinician  Participation Level: Active  Behavioral Response: NeatAlertDepressed  Type of Therapy: Individual Therapy  Treatment Goals addressed:    Problem: Depression  Goal: Decrease depressive symptoms and improve levels of effective functioning-pt reports a decrease in overall depression symptoms 3 out of 5 sessions documented.  Outcome: Progressing Goal: Develop healthy thinking patterns and beliefs about self, others, and the world that lead to the alleviation and help prevent the relapse of depression per self report 3 out of 5 sessions documented.   Outcome: Progressing Intervention: Assist with coping skills and behavior Note: Reviewed  Intervention: Encourage self-care activities Note: Encouraged  Intervention: Encourage verbalization of feelings/concerns/expectations Note: Allowed pt to explore/express   Problem: Anxiety  Goal: Reduce overall frequency, intensity, and duration of the anxiety so that daily functioning is not impaired per pt self report 3 out of 5 sessions documented.   Outcome: Progressing Goal: Learn and implement coping skills that result in a reduction of anxiety and worry, and improve daily functioning per pt report 3 out of 5 sessions documented  Outcome: Progressing      ProgressTowards Goals: Progressing  Interventions: CBT, Motivational Interviewing, and Strength-based  Summary: Stacy Thompson is a 46 y.o. female who presents with improving symptoms related to depression and anxiety diagnoses. Pt reports that she is getting better sleep--sometimes hard to fall asleep or stay asleep. Pt reports that she feels fatigued at times. Pt reports that she is compliant with prozac.   Allowed pt to explore and express thoughts and feelings associated with recent life situations and external stressors.Pt reports that she is  upset with [employer]--pt states that she supposedly has completed all areas for graduating/completing her degree, but still doesn't have her degree. Pt states that the school stated it would be 12 more weeks before she could have her degree. Encouraged pt to stay in good communication with school personnel and reach out/ask questions. Pt feels this delay is delaying the opportunity for her to get another job. Pt seeking a social work position.   Pt reports that relationships with her sister and her father have improved since last visit--pt trying to communicate better with them.   Continued recommendations are as follows: self care behaviors, positive social engagements, focusing on overall work/home/life balance, and focusing on positive physical and emotional wellness.   Suicidal/Homicidal: No  Therapist Response: Pt is continuing to apply interventions learned in session into daily life situations. Pt is currently on track to meet goals utilizing interventions mentioned above. Personal growth and progress noted. Treatment to continue as indicated.   Plan: Return again in 4 weeks.  Diagnosis:  Encounter Diagnoses  Name Primary?   MDD (major depressive disorder), recurrent episode, moderate (HCC) Yes   Generalized anxiety disorder    Collaboration of Care: Other n/a at time of session  Patient/Guardian was advised Release of Information must be obtained prior to any record release in order to collaborate their care with an outside provider. Patient/Guardian was advised if they have not already done so to contact the registration department to sign all necessary forms in order for [place] to release information regarding their care.   Consent: Patient/Guardian gives verbal consent for treatment and assignment of benefits for services provided during this visit. Patient/Guardian expressed understanding and agreed to proceed.   Glen Aubrey, LCSW 06/20/2022

## 2022-06-24 ENCOUNTER — Telehealth: Payer: Self-pay

## 2022-06-24 DIAGNOSIS — G43909 Migraine, unspecified, not intractable, without status migrainosus: Secondary | ICD-10-CM | POA: Insufficient documentation

## 2022-06-24 MED ORDER — OZEMPIC (0.25 OR 0.5 MG/DOSE) 2 MG/1.5ML ~~LOC~~ SOPN: 0.5000 mg | PEN_INJECTOR | SUBCUTANEOUS | 0 refills | Status: DC

## 2022-06-24 MED ORDER — SEMAGLUTIDE (1 MG/DOSE) 4 MG/3ML ~~LOC~~ SOPN: 1.0000 mg | PEN_INJECTOR | SUBCUTANEOUS | 0 refills | Status: DC

## 2022-06-24 NOTE — Assessment & Plan Note (Signed)
Patient would like to discuss with nutritionist for different food options. Mentions she doesn't typically like to take more medications than needed. She is interested in nutritional assistance. I will place referral for this. I have also placed refill for Ozempic.  - Nutrition referral placed - Continue Ozempic 1mg  weekly

## 2022-06-24 NOTE — Assessment & Plan Note (Signed)
-   Influenza vaccine given today 

## 2022-06-24 NOTE — Assessment & Plan Note (Signed)
Stacy Thompson is presenting today to discuss her migraines. She reports headaches that occur 2-3 times monthly, typically present when she wakes up and lasts at least 6-8 hours. Further describes unilateral throbbing-like headaches that are debilitating in nature. She does not describe any aura-like symptoms such as vision changes, ringing in ears, neuropathic pain. She has tried numerous over the counter medications, such as Exedrin, although this typically does not control her symptoms. She denies fevers, chills, body aches, snoring, daytime sleepiness, paroxysmal nocturnal dyspnea, focal numbness/tingling.  Patient's history most consistent with migraine headaches. Given just a few headaches monthly, will plan for abortive therapy as needed. If headaches increase in frequency, can discuss preventative therapy. Stacy Thompson and I also discussed avoiding certain triggers, such as caffeine and alcohol, that could exacerbate these headaches.  - Start sumatriptan 25mg  as needed - Avoid triggers - Return precautions given

## 2022-06-24 NOTE — Telephone Encounter (Signed)
semaglutide

## 2022-06-24 NOTE — Telephone Encounter (Signed)
I have re-filled Stacy Thompson's semaglutide. Continued at increased dose.

## 2022-06-24 NOTE — Progress Notes (Signed)
   CC: migraines  HPI:  Stacy Thompson is a 46 y.o. person with medical history as below presenting to [facility] for migraines.  Please see problem-based list for further details, assessments, and plans.  Past Medical History:  Diagnosis Date   Anxiety    Dizziness    Headache    Headache(784.0)    High blood pressure    Joint pain    Lactose intolerance    Left arm pain    Low back pain headach   Lower extremity edema    SOB (shortness of breath)    UTI (lower urinary tract infection)    Review of Systems:  As per HPI  Physical Exam:  Vitals:   06/20/22 1439  BP: 132/87  Pulse: 88  Temp: 98 F (36.7 C)  TempSrc: Oral  SpO2: 97%  Weight: (!) 345 lb 12.8 oz (156.9 kg)  Height: 5\' 5"  (1.651 m)   General: Resting comfortably in chair, no acute distress CV: Regular rate, rhythm. No murmurs appreciated. Pulm: Normal work of breathing on room air. Clear to auscultation bilaterally.  Neuro: Awake, alert, conversing appropriately. Cranial nerves in tact. Motor 5/5 throughout, sensation in tact throughout. Finger to nose normal bilaterally.   Psych: Normal mood, affect, speech  Assessment & Plan:   Migraine headache Stacy Thompson is presenting today to discuss her migraines. She reports headaches that occur 2-3 times monthly, typically present when she wakes up and lasts at least 6-8 hours. Further describes unilateral throbbing-like headaches that are debilitating in nature. She does not describe any aura-like symptoms such as vision changes, ringing in ears, neuropathic pain. She has tried numerous over the counter medications, such as Exedrin, although this typically does not control her symptoms. She denies fevers, chills, body aches, snoring, daytime sleepiness, paroxysmal nocturnal dyspnea, focal numbness/tingling.  Patient's history most consistent with migraine headaches. Given just a few headaches monthly, will plan for abortive therapy as needed. If headaches  increase in frequency, can discuss preventative therapy. Stacy Thompson and I also discussed avoiding certain triggers, such as caffeine and alcohol, that could exacerbate these headaches.  - Start sumatriptan 25mg  as needed - Avoid triggers - Return precautions given  Class 3 severe obesity in adult [facility]) Patient would like to discuss with nutritionist for different food options. Mentions she doesn't typically like to take more medications than needed. She is interested in nutritional assistance. I will place referral for this. I have also placed refill for Ozempic.  - Nutrition referral placed - Continue Ozempic 1mg  weekly  Healthcare maintenance - Influenza vaccine given today  Patient discussed with Dr.  Garnet Koyanagi, MD Internal Medicine PGY-3 Pager: [phone]

## 2022-06-26 NOTE — Progress Notes (Signed)
Internal Medicine Clinic Attending  Case discussed with Dr. Marijo Conception  At the time of the visit.  We reviewed the resident's history and exam and pertinent patient test results.  I agree with the assessment, diagnosis, and plan of care documented in the resident's note. Dr. Marijo Conception and I also discussed the possibility of OSA contributing to headaches given patient risk factors. Would continue to address if no improvement with migraine therapy.

## 2022-06-26 NOTE — Addendum Note (Signed)
Addended by: Dickie La on: 06/26/2022 04:41 PM   Modules accepted: Level of Service

## 2022-07-18 ENCOUNTER — Encounter: Payer: BC Managed Care – PPO | Attending: Internal Medicine | Admitting: Skilled Nursing Facility1

## 2022-07-18 ENCOUNTER — Encounter: Payer: Self-pay | Admitting: Skilled Nursing Facility1

## 2022-07-18 VITALS — Ht 65.0 in | Wt 351.8 lb

## 2022-07-18 DIAGNOSIS — R7303 Prediabetes: Secondary | ICD-10-CM | POA: Insufficient documentation

## 2022-07-18 DIAGNOSIS — E669 Obesity, unspecified: Secondary | ICD-10-CM | POA: Diagnosis present

## 2022-07-18 NOTE — Progress Notes (Signed)
Medical Nutrition Therapy  Appointment Start time:  3:39 pm  Appointment End time:  4:36 pm  Primary concerns today: weight loss and needed to know what to eat  Referral diagnosis: r73.03/e66.01 Preferred learning style: No preference indicated Learning readiness: Ready  Body Composition Scale 07/18/22  Current Body Weight 351.8  Total Body Fat % 51.9  Visceral Fat 25  Fat-Free Mass % 48.0   Total Body Water % 38.5  Muscle-Mass lbs 33.3  BMI 58.2  Body Fat Displacement          Torso  lbs 113.5         Left Leg  lbs 22.7         Right Leg  lbs 22.7         Left Arm  lbs 11.3         Right Arm   lbs 11.3     NUTRITION ASSESSMENT   Clinical Medical Hx: Headaches, Prediabetes Medications: see list: ozempic  Labs: LDL 111 (high), A1C 6.1 (high) Notable Signs/Symptoms: None reported.  Lifestyle & Dietary Hx Pt states that she has a hectic lifestyle and feels stressed most of the time.  Dietitian asked pt what their expectations of today's visit would be and pt states she wanted a meal plan on what to specially eat throughout her day and week. Dietitian advised pt meal plans are not conducive to long term behavior change. Dietitian advised pt the main point of this session would be to educate pt on the foundations of a healthy diet/relationship with food in order to effect long term change. Pt verbalized agreement to this plan. Pt states she wants to feel better by eating better.  Pt states she works 7am-5pm and works when she get home multiple times a week. Pt states she lives with her brother and father. (Cares for both of them right now) Pt states she does not feel rested when she wakes up and feels tired throughout the day. Pt states she is having a sleep study done probably at the beginning of the year.  Pt states she has headaches several times a month.  Estimated daily fluid intake: Not reported. Supplements: None reported.  Sleep: 5-6 hours/night (4 continuous) Stress /  self-care: None reported. Current average weekly physical activity: class at [facility] on Thursday's   24-Hr Dietary Recall First Meal: coffee (2 cream and 5 sugar) OR lattes (McDonald's or Starbucks) OR egg sandwich OR banana nut bread Snack: None reported.  Second Meal: eating out ([employer] salads with ranch and [place] or fried chicken sandwich)  Snack: None reported.  Third Meal: pork + cucumbers OR fried chicken + green beans + macaroni and cheese Snack: 2 peanut butter sandwiches OR cake OR leftovers from dinner  Beverages: coffee, (varies daily) water, lipton green teas, sodas (Dr. Reino Kent or Sprite), pink lemonade    NUTRITION DIAGNOSIS  NB-1.1 Food and nutrition-related knowledge deficit As related to no prior education from a nutrition professional.  As evidenced by 24-hour recall.   NUTRITION INTERVENTION  Nutrition education (E-1) on the following topics:  Educated pt on easy to make meals that are still nutritious. Educated pt on decreasing sugar and fat intake when eating out of the house.  Educated pt on pre-diabetes and dietary choices to control A1C. Discussed with pt about mindful eating throughout the day.  Handouts Provided Include  My Plate Meal Ideas  Learning Style & Readiness for Change Teaching method utilized: Visual & Auditory  Demonstrated degree of understanding  via: Teach Back  Barriers to learning/adherence to lifestyle change: None identified.  Goals Established by Pt Adding more non-starchy vegetables to dinner and adding a complex carbohydrates. Decreasing sugar intake in coffee.  Decreasing snacking after dinner, by not eating anything after to better understand pt's relationship with food.    MONITORING & EVALUATION Dietary intake, weekly physical activity.  Next Steps  Patient is to follow-up in mid-January.

## 2022-07-23 ENCOUNTER — Ambulatory Visit (INDEPENDENT_AMBULATORY_CARE_PROVIDER_SITE_OTHER): Payer: BC Managed Care – PPO | Admitting: Student

## 2022-07-23 DIAGNOSIS — U071 COVID-19: Secondary | ICD-10-CM | POA: Diagnosis not present

## 2022-07-23 NOTE — Patient Instructions (Signed)
Thank you so much for coming to the clinic today! Sorry to hear about your COVID infection, keep yourself hydrated and keep taking the theraflu. If you notice your symptoms worsening please do not hesitate to call the clinic again.   If you have any questions please feel free to the call the clinic at anytime at [phone]. It was a pleasure talking to you!  Best, Dr. Thomasene Ripple

## 2022-07-23 NOTE — Assessment & Plan Note (Signed)
Pt presents via telehealth for a positive COVID-19 Test. She went to a funeral on Friday, and started "feeling bad" on Saturday with a headache and generalized fatigue. Symptoms worsened on Sunday and Monday as she developed a sore throat, congestion, fever up to 101.5, and a cough. She is vaccinated but has not received the booster shots. Per patient, her fever today is at 99.5 and she states that she is feeling a little better. She has no sick contacts, however sister is currently in the ICU for COVID infection. This is the first time she has had COVID.   This is currently Day 4 of symptoms, and Day 2 of a positive test.  She denies any chest pain, nausea, vomiting, or shortness of breath.  Plan:  - Discussed the option for paxlovid, as pt has risk factor for progression of infection with obesity. However, since symptoms have already started to improve, do not believe she would get much benefit.  - Recommended supportive therapy with theraflu, hydration, and tylenol for symptom control - Discussed that if symptoms do not improve within the next two days to make another appointment

## 2022-07-23 NOTE — Progress Notes (Signed)
   CC: COVID-19 Infection  This is a telephone encounter between Stacy Thompson and Stacy Thompson on 07/23/2022 for a COVID-19 infection. The visit was conducted with the patient located at home and [place] Stacy Thompson at [facility]. The patient's identity was confirmed using their DOB and current address. The patient has consented to being evaluated through a telephone encounter and understands the associated risks (an examination cannot be done and the patient may need to come in for an appointment) / benefits (allows the patient to remain at home, decreasing exposure to coronavirus). I personally spent 10 minutes on medical discussion.   HPI:  [name] is a 46 y.o. with PMH as below.   Please see A&P for assessment of the patient's acute and chronic medical conditions.   Past Medical History:  Diagnosis Date   Anxiety    Dizziness    Headache    Headache(784.0)    High blood pressure    Joint pain    Lactose intolerance    Left arm pain    Low back pain headach   Lower extremity edema    SOB (shortness of breath)    UTI (lower urinary tract infection)    Review of Systems:    Negative except stated in Assessment and Plan    Assessment & Plan:   COVID-19 Pt presents via telehealth for a positive COVID-19 Test. She went to a funeral on Friday, and started "feeling bad" on Saturday with a headache and generalized fatigue. Symptoms worsened on Sunday and Monday as she developed a sore throat, congestion, fever up to 101.5, and a cough. She is vaccinated but has not received the booster shots. Per patient, her fever today is at 99.5 and she states that she is feeling a little better. She has no sick contacts, however sister is currently in the ICU for COVID infection. This is the first time she has had COVID.   This is currently Day 4 of symptoms, and Day 2 of a positive test.  She denies any chest pain, nausea, vomiting, or shortness of breath.  Plan:  - Discussed the  option for paxlovid, as pt has risk factor for progression of infection with obesity. However, since symptoms have already started to improve, do not believe she would get much benefit.  - Recommended supportive therapy with theraflu, hydration, and tylenol for symptom control - Discussed that if symptoms do not improve within the next two days to make another appointment     Patient seen with Dr. Maryagnes Amos Osric Klopf Internal Medicine Resident

## 2022-07-25 NOTE — Addendum Note (Signed)
Addended by: Erlinda Hong T on: 07/25/2022 10:47 AM   Modules accepted: Level of Service

## 2022-07-25 NOTE — Progress Notes (Signed)
Internal Medicine Clinic Attending  Case discussed with Dr. Thomasene Ripple  At the time of the visit.  We reviewed the resident's history and pertinent patient test results.  I agree with the assessment, diagnosis, and plan of care documented in the resident's note.

## 2022-07-29 ENCOUNTER — Telehealth: Payer: Self-pay | Admitting: Student

## 2022-07-29 NOTE — Telephone Encounter (Addendum)
Please call the pt back. OV was 07/23/2022 with Dr Nooruddin. Pt seen for Covid.  Pt is feeling a little better but, she is now having pain in both ears, ears are also popping and her  throat ishurting. Pt wanting to know if anything can be called in.

## 2022-07-29 NOTE — Telephone Encounter (Signed)
Pt calling back again to speak with a nurse.   Shon Hough F   Telephone Encounter Addendum   Creation Time: 07/29/2022  2:22 PM   Please call the pt back. OV was 07/23/2022 with Dr Nooruddin. Pt seen for Covid.  Pt is feeling a little better but, she is now having pain in both ears, ears are also popping and her  throat ishurting. Pt wanting to know if anything can be called in.

## 2022-07-29 NOTE — Telephone Encounter (Signed)
Return pt's call. Stated she's feeling better but her throat is sore and her ears are popping and hurts especially when blowing her nose. She thinks she might have an ear infection; requesting an abx. She has not re-tested herself for covid. Please advise Thanks

## 2022-07-30 ENCOUNTER — Other Ambulatory Visit: Payer: Self-pay

## 2022-07-30 ENCOUNTER — Ambulatory Visit (INDEPENDENT_AMBULATORY_CARE_PROVIDER_SITE_OTHER): Payer: BC Managed Care – PPO | Admitting: Internal Medicine

## 2022-07-30 ENCOUNTER — Encounter: Payer: Self-pay | Admitting: Internal Medicine

## 2022-07-30 VITALS — BP 156/73 | HR 84 | Temp 97.9°F | Ht 65.0 in | Wt 355.0 lb

## 2022-07-30 DIAGNOSIS — R0981 Nasal congestion: Secondary | ICD-10-CM | POA: Insufficient documentation

## 2022-07-30 MED ORDER — PSEUDOEPHEDRINE-GUAIFENESIN ER 120-1200 MG PO TB12: 1.0000 | ORAL_TABLET | Freq: Two times a day (BID) | ORAL | 0 refills | Status: AC

## 2022-07-30 MED ORDER — FLUTICASONE PROPIONATE 50 MCG/ACT NA SUSP: 1.0000 | Freq: Every day | NASAL | 2 refills | Status: AC

## 2022-07-30 NOTE — Telephone Encounter (Signed)
Hi Glenda, She will need to be evaluated prior to starting antibiotics as I am not familiar with her or her presentation. I don't know if other residents have availability this week but if not I recommend she go to urgent care. Thanks!

## 2022-07-30 NOTE — Telephone Encounter (Signed)
Called pt - informed she needs to schedule an appt to be evaluated. Appt scheduled w/Dr August Saucer this am @ [phone].

## 2022-07-30 NOTE — Assessment & Plan Note (Signed)
Patient tested positive for COVID-19 9 days ago. Her worst symptoms at that time were severe body aches with fever and chills which have resolved. About 4-5 days ago she began experiencing ear pain with sneezing, coughing, or blowing her nose.  She says that her ears feel like they're popping whenever she sneezes, coughs, or blows her nose. She also has pain during these events but not at rest. She has intermittent cough that is productive. She has postnasal drip that is worse when she wakes up each morning. She is feeling better from an energy perspective and denies continued myalgias. No chest pain, shortness of breath, diarrhea, inability to tolerate PO intake.  She was previously taking TheraFlu daily but since she has begun to feel better she has not been taking it.  At this moment she is not taking any over-the-counter medications. Assessment: Physical exam is not concerning for acute otitis media.  I suspect her symptoms are related to continued sinus congestion and should resolve with improvement of the sinus congestion. Plan: Will send in Mucinex D and Flonase to assist with relief of congestive symptoms.  I have counseled patient on staying hydrated while taking these medications.  I have counseled her to contact the office if she begins to feel worse or develops a fever so that we can reassess her at that time.

## 2022-07-30 NOTE — Patient Instructions (Signed)
Stacy Thompson,  I'm sorry you aren't feeling well!  I have sent in flonase for you to spray in each nostril once daily. I have also sent in mucinex-D for you to take one tablet twice daily. Take these medications until your symptoms resolve, I expect over the next 3-5 days.  If you feel more poorly or develop fever please let us know!  My best, Dr. August Saucer

## 2022-07-30 NOTE — Progress Notes (Signed)
Internal Medicine Clinic Attending  I saw and evaluated the patient.  I personally confirmed the key portions of the history and exam documented by Dr. August Saucer and I reviewed pertinent patient test results.  The assessment, diagnosis, and plan were formulated together and I agree with the documentation in the resident's note.    I examined patient's ears and see no signs of acute OM. I think symptoms are related to congestion, will treat as described above.

## 2022-07-30 NOTE — Progress Notes (Signed)
CC: Ear pain  HPI:  Stacy Thompson is a 46 y.o. person with past medical history as detailed below who presents today for ear pain after COVID-19 infection.  Please see problem charting for detail assessment plan.  Past Medical History:  Diagnosis Date   Anxiety    Dizziness    Headache    Headache(784.0)    High blood pressure    Joint pain    Lactose intolerance    Left arm pain    Low back pain headach   Lower extremity edema    SOB (shortness of breath)    UTI (lower urinary tract infection)    Review of Systems: Negative unless otherwise stated.  Physical Exam:  Vitals:   07/30/22 0945  BP: (!) 156/73  Pulse: 84  Temp: 97.9 F (36.6 C)  TempSrc: Oral  SpO2: 100%  Weight: (!) 355 lb (161 kg)  Height: 5\' 5"  (1.651 m)   Physical Exam Constitutional:      General: She is not in acute distress.    Appearance: Normal appearance. She is not ill-appearing.  HENT:     Right Ear: Hearing, tympanic membrane, ear canal and external ear normal.     Left Ear: Hearing, tympanic membrane and external ear normal.     Ears:     Comments: Mild erythema to the medial aspect of the left TM.    Nose:     Right Sinus: No maxillary sinus tenderness or frontal sinus tenderness.     Left Sinus: No maxillary sinus tenderness or frontal sinus tenderness.     Mouth/Throat:     Pharynx: No pharyngeal swelling, oropharyngeal exudate or posterior oropharyngeal erythema.     Tonsils: No tonsillar exudate.  Cardiovascular:     Rate and Rhythm: Normal rate and regular rhythm.  Pulmonary:     Effort: Pulmonary effort is normal.     Breath sounds: Normal breath sounds.  Neurological:     General: No focal deficit present.     Mental Status: She is alert and oriented to person, place, and time.  Psychiatric:        Mood and Affect: Mood normal.        Behavior: Behavior normal.    Assessment & Plan:   See Encounters Tab for problem based charting.  Sinus  congestion Patient tested positive for COVID-19 9 days ago. Her worst symptoms at that time were severe body aches with fever and chills which have resolved. About 4-5 days ago she began experiencing ear pain with sneezing, coughing, or blowing her nose.  She says that her ears feel like they're popping whenever she sneezes, coughs, or blows her nose. She also has pain during these events but not at rest. She has intermittent cough that is productive. She has postnasal drip that is worse when she wakes up each morning. She is feeling better from an energy perspective and denies continued myalgias. No chest pain, shortness of breath, diarrhea, inability to tolerate PO intake.  She was previously taking TheraFlu daily but since she has begun to feel better she has not been taking it.  At this moment she is not taking any over-the-counter medications. Assessment: Physical exam is not concerning for acute otitis media.  I suspect her symptoms are related to continued sinus congestion and should resolve with improvement of the sinus congestion. Plan: Will send in Mucinex D and Flonase to assist with relief of congestive symptoms.  I have counseled patient on staying  hydrated while taking these medications.  I have counseled her to contact the office if she begins to feel worse or develops a fever so that we can reassess her at that time.  Patient seen with Dr.  Cain Sieve

## 2022-08-20 ENCOUNTER — Encounter: Payer: BC Managed Care – PPO | Admitting: Nurse Practitioner

## 2022-08-27 ENCOUNTER — Ambulatory Visit (HOSPITAL_COMMUNITY): Payer: BC Managed Care – PPO | Admitting: Licensed Clinical Social Worker

## 2022-08-28 ENCOUNTER — Ambulatory Visit (HOSPITAL_COMMUNITY): Payer: BC Managed Care – PPO | Admitting: Licensed Clinical Social Worker

## 2022-09-18 ENCOUNTER — Ambulatory Visit: Payer: BC Managed Care – PPO | Admitting: Skilled Nursing Facility1

## 2022-09-19 NOTE — Progress Notes (Signed)
Attended 6 classes of PREP

## 2022-09-25 ENCOUNTER — Ambulatory Visit: Payer: BC Managed Care – PPO | Admitting: Skilled Nursing Facility1

## 2023-01-12 ENCOUNTER — Encounter: Payer: Self-pay | Admitting: *Deleted

## 2023-03-13 ENCOUNTER — Ambulatory Visit: Payer: BC Managed Care – PPO | Admitting: Student

## 2023-03-13 ENCOUNTER — Other Ambulatory Visit: Payer: Self-pay | Admitting: Student

## 2023-03-13 VITALS — BP 136/103 | HR 84 | Temp 98.3°F | Ht 64.0 in | Wt 370.3 lb

## 2023-03-13 DIAGNOSIS — E559 Vitamin D deficiency, unspecified: Secondary | ICD-10-CM | POA: Diagnosis not present

## 2023-03-13 DIAGNOSIS — I10 Essential (primary) hypertension: Secondary | ICD-10-CM

## 2023-03-13 DIAGNOSIS — E785 Hyperlipidemia, unspecified: Secondary | ICD-10-CM

## 2023-03-13 DIAGNOSIS — R42 Dizziness and giddiness: Secondary | ICD-10-CM

## 2023-03-13 DIAGNOSIS — Z6841 Body Mass Index (BMI) 40.0 and over, adult: Secondary | ICD-10-CM

## 2023-03-13 DIAGNOSIS — R7303 Prediabetes: Secondary | ICD-10-CM

## 2023-03-13 DIAGNOSIS — F332 Major depressive disorder, recurrent severe without psychotic features: Secondary | ICD-10-CM | POA: Diagnosis not present

## 2023-03-13 LAB — POCT GLYCOSYLATED HEMOGLOBIN (HGB A1C): Hemoglobin A1C: 5.9 % — AB (ref 4.0–5.6)

## 2023-03-13 MED ORDER — FLUOXETINE HCL 10 MG PO CAPS: 10.0000 mg | ORAL_CAPSULE | Freq: Every day | ORAL | 1 refills | Status: DC

## 2023-03-13 MED ORDER — SEMAGLUTIDE (1 MG/DOSE) 4 MG/3ML ~~LOC~~ SOPN: 0.5000 mg | PEN_INJECTOR | SUBCUTANEOUS | 2 refills | Status: DC

## 2023-03-13 NOTE — Assessment & Plan Note (Addendum)
Patient has a history of prediabetes last A1c was 6.1 a year ago, still Ozempic 1 mg but has not refilled her prescription in over 6 months.  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  Drug adherence in controlling her sugar levels has been adequately discussed with the patient. -Hemoglobin A1c  -Counsel  patient's on medication  adherence  Addendum A1c came back as 5.9 which indicates prediabetes. Prescribing Ozempic 0.5mg  weekly for prediabetes in the setting of morbid obesity

## 2023-03-13 NOTE — Assessment & Plan Note (Signed)
She has a history of hyperlipidemia currently not being managed with any medication.  Last LDL in June last year was 161.  Her 10-year risk of heart or cardiovascular disease is 2%, will not start statin therapy at this time. Will consider repeating lipid panel today. - Lipid panel

## 2023-03-13 NOTE — Progress Notes (Signed)
CC: Dizzy spells  HPI:  StacyStacy Thompson is a 47 y.o. female living with a history stated below and presents today for the spells. Please see problem based assessment and plan for additional details.  Past Medical History:  Diagnosis Date   Anxiety    Dizziness    Headache    Headache(784.0)    High blood pressure    Joint pain    Lactose intolerance    Left arm pain    Low back pain headach   Lower extremity edema    SOB (shortness of breath)    UTI (lower urinary tract infection)     Current Outpatient Medications on File Prior to Visit  Medication Sig Dispense Refill   fluticasone (FLONASE) 50 MCG/ACT nasal spray Place 1 spray into both nostrils daily. 11.1 mL 2   SUMAtriptan (IMITREX) 25 MG tablet Take 1 tablet (25 mg total) by mouth every 2 (two) hours as needed for migraine. May repeat in 2 hours if headache persists or recurs. 5 tablet 0   Vitamin D, Ergocalciferol, (DRISDOL) 1.25 MG (50000 UNIT) CAPS capsule Take 1 capsule (50,000 Units total) by mouth every 7 (seven) days. 12 capsule 0   No current facility-administered medications on file prior to visit.    Family History  Problem Relation Age of Onset   Depression Mother    Hypertension Father    Obesity Father    Breast cancer Neg Hx     Social History   Socioeconomic History   Marital status: Single    Spouse name: Not on file   Number of children: Not on file   Years of education: Not on file   Highest education level: Not on file  Occupational History   Occupation: Bus driver  Tobacco Use   Smoking status: Never   Smokeless tobacco: Never  Substance and Sexual Activity   Alcohol use: Not Currently    Comment: socially   Drug use: No   Sexual activity: Yes    Birth control/protection: Condom  Other Topics Concern   Not on file  Social History Narrative   Not on file   Social Determinants of Health   Financial Resource Strain: Not on file  Food Insecurity: Not on file   Transportation Needs: Not on file  Physical Activity: Not on file  Stress: Not on file  Social Connections: Not on file  Intimate Partner Violence: Not on file    Review of Systems: ROS negative except for what is noted on the assessment and plan.  Vitals:   03/13/23 0952 03/13/23 0956 03/13/23 1001 03/13/23 1003  BP: (!) 141/87 (!) 149/76 124/69 (!) 136/103  Pulse: 73 83 78 84  Temp: 98.3 F (36.8 C)     TempSrc: Oral     SpO2: 100%     Weight: (!) 370 lb 4.8 oz (168 kg)     Height: 5\' 4"  (1.626 m)       Physical Exam: Constitutional: Briefly obese woman sitting in the chair ,not in any acute distress ,responding appropriately to questions  Cardiovascular: Abnormal heart sounds was appreciated ,normal S1-S2 Pulmonary/Chest:  lungs clear to auscultation bilaterally Abdominal: soft, non-tender, non-distended MSK: 5 out of 5 strength in upper extremities   Assessment & Plan:   Prediabetes Patient has a history of prediabetes last A1c was 6.1 a year ago, still Ozempic 1 mg but has not refilled her prescription in over 6 months.  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  Drug adherence in controlling her sugar levels has been adequately discussed with the patient. -Hemoglobin A1c  -Counsel  patient's on medication  adherence  Addendum A1c came back as 5.9 which indicates prediabetes. Prescribing Ozempic 0.5mg  weekly for prediabetes in the setting of morbid obesity   Dizziness Patient presented to the office today due to concerns of dizzy spells that has been going on for the past 3 months.  Note she has been seen in the hospital for recurrent ear infection and pain.  She stated the dizziness get worse with eating or drinking, nothing makes it worse or better.  The dizzy spells for a few seconds and then she is fine in between the spells,  denies any headache or ringing in the ear during this time.  Considering benign paroxysmal positional vertigo, however pertinent negative for  exacerbating of symptoms with head movement lying down or getting up from bed or even rolling over in bed.  He also no associated nausea or vomiting .  Patient says however counseled on repositioning maneuvers and given the print out how to do these maneuvers.  Will reassess the patient dizziness during our next visit.  Hyperlipidemia She has a history of hyperlipidemia currently not being managed with any medication.  Last LDL in June last year was 710.  Her 10-year risk of heart or cardiovascular disease is 2%, will not start statin therapy at this time. Will consider repeating lipid panel today. - Lipid panel  Hypertension Patient reported history of hypertension.  Pressure in the office today is 149/76, repeat is 136/103.  Patient is currently not taking any medication for her blood pressure.  She also expresses concerns of loud snoring, tired all the time, and also being sleepy at work even though she gets about 6 to 7 hours of sleep every night, which is concerning for obstructive sleep apnea induced hypertension in the setting of her BMI of 63.6.  Will consider referring the patient for a sleep study to further assess. -Referral for sleep study  Depression Patient has a history of depression currently on Prozac, says her depression has been well-controlled even though she get into depressed mood every now and then.  Denies any suicidal ideation at this time. -Continue Prozac 10 mg daily   Patient seen with Dr. Joycelyn Das, M.D [facility] Phone: [phone] Date 03/13/2023 Time 11:51 AM

## 2023-03-13 NOTE — Assessment & Plan Note (Signed)
Patient has a history of depression currently on Prozac, says her depression has been well-controlled even though she get into depressed mood every now and then.  Denies any suicidal ideation at this time. -Continue Prozac 10 mg daily

## 2023-03-13 NOTE — Assessment & Plan Note (Addendum)
Patient presented to the office today due to concerns of dizzy spells that has been going on for the past 3 months.  Note she has been seen in the hospital for recurrent ear infection and pain.  She stated the dizziness get worse with eating or drinking, nothing makes it worse or better.  The dizzy spells for a few seconds and then she is fine in between the spells, denies any headache or ringing in the ear during this time.  Considering benign paroxysmal positional vertigo, however pertinent negative for  exacerbating of symptoms with head movement lying down or getting up from bed or even rolling over in bed.  He also no associated nausea or vomiting .  Patient says however counseled on repositioning maneuvers and given the print out how to do these maneuvers.  Will reassess the patient dizziness during our next visit.

## 2023-03-13 NOTE — Patient Instructions (Signed)
Thank you, Ms.Gelisa A Mcelwain for allowing [place] to provide your care today. Today we discussed your general health, dizziness, cholesterol, and blood pressure. Will do some blood work today I will call you when your blood work results comes back I will refill your Prozac as requested I will send you a referral for sleep study Thank you for allowing [place] to see you today and please do not hesitate to contact our office if you have any other concerns  I have ordered the following labs for you:  Lab Orders         Hemoglobin A1c         BMP8+Anion Gap         Lipid Profile       Tests ordered today:    Referrals ordered today:   Referral Orders  No referral(s) requested today     I have ordered the following medication/changed the following medications:   Stop the following medications: There are no discontinued medications.   Start the following medications: No orders of the defined types were placed in this encounter.    Follow up: 3-4 months   Remember:   Should you have any questions or concerns please call the internal medicine clinic at [phone].    Kathleen Lime, M.D [facility]

## 2023-03-13 NOTE — Assessment & Plan Note (Signed)
Patient reported history of hypertension.  Pressure in the office today is 149/76, repeat is 136/103.  Patient is currently not taking any medication for her blood pressure.  She also expresses concerns of loud snoring, tired all the time, and also being sleepy at work even though she gets about 6 to 7 hours of sleep every night, which is concerning for obstructive sleep apnea induced hypertension in the setting of her BMI of 63.6.  Will consider referring the patient for a sleep study to further assess. -Referral for sleep study

## 2023-03-14 NOTE — Progress Notes (Signed)
Internal Medicine Attending:  I personally saw and examined the patient. Discussed with the resident and agree with the resident's findings and plan of care as documented in the resident's note.  Tyson Alias, MD

## 2023-03-17 ENCOUNTER — Other Ambulatory Visit: Payer: Self-pay

## 2023-03-17 DIAGNOSIS — R7303 Prediabetes: Secondary | ICD-10-CM

## 2023-03-17 NOTE — Telephone Encounter (Signed)
Incoming fax from pharmacy Medication:ozempic 2 mg/3 ml 0.5 mg weekly

## 2023-03-18 ENCOUNTER — Telehealth: Payer: Self-pay | Admitting: *Deleted

## 2023-03-18 MED ORDER — SEMAGLUTIDE (1 MG/DOSE) 4 MG/3ML ~~LOC~~ SOPN: 0.5000 mg | PEN_INJECTOR | SUBCUTANEOUS | 2 refills | Status: AC

## 2023-03-20 NOTE — Telephone Encounter (Signed)
A user error has taken place: encounter opened in error, closed for administrative reasons.

## 2023-04-14 ENCOUNTER — Telehealth: Payer: Self-pay

## 2023-04-14 NOTE — Telephone Encounter (Signed)
RTC to patient stated is to restart her Ozempic.  Patient has concerns and wanted to know if there is something else that she can be prescribed. Will await call from her doctor to discuss.

## 2023-04-14 NOTE — Telephone Encounter (Signed)
Requesting to speak with a nurse about something. Please call pt back.  °

## 2023-04-30 ENCOUNTER — Encounter: Payer: BC Managed Care – PPO | Admitting: Student

## 2023-09-15 ENCOUNTER — Other Ambulatory Visit: Payer: Self-pay | Admitting: Student

## 2023-09-15 DIAGNOSIS — F332 Major depressive disorder, recurrent severe without psychotic features: Secondary | ICD-10-CM

## 2023-12-13 ENCOUNTER — Other Ambulatory Visit: Payer: Self-pay | Admitting: Internal Medicine

## 2023-12-13 DIAGNOSIS — F332 Major depressive disorder, recurrent severe without psychotic features: Secondary | ICD-10-CM

## 2023-12-15 NOTE — Telephone Encounter (Signed)
 NO LONGER [facility] PATIENT

## 2024-01-13 IMAGING — MG MM DIGITAL DIAGNOSTIC UNILAT*L* W/ TOMO W/ CAD
4 series · 4 of 12 positions shown · non-contrast
Comparison: Previous exam(s).

CLINICAL DATA: The patient was called back from screening
mammography due to a left breast mass seen in 7543. The patient only
received 6 months of follow-up imaging. This is the final follow-up.

EXAM:
DIGITAL DIAGNOSTIC UNILATERAL LEFT MAMMOGRAM WITH TOMOSYNTHESIS AND
CAD; ULTRASOUND LEFT BREAST LIMITED
TECHNIQUE: Left digital diagnostic mammography and breast tomosynthesis was
performed. The images were evaluated with computer-aided detection.;
Targeted ultrasound examination of the left breast was performed.

[L MLO synth-2D]
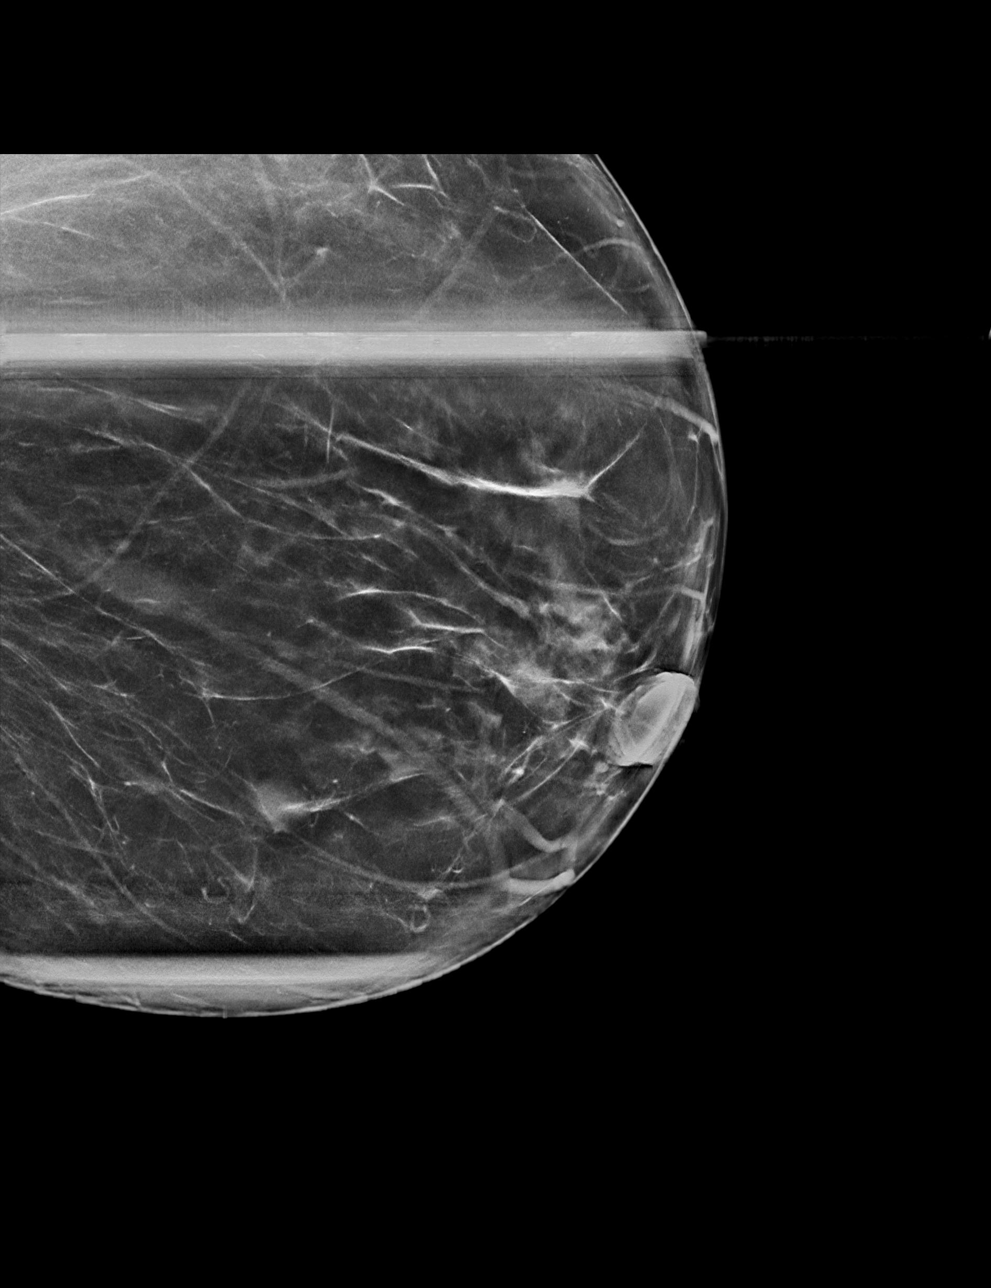

[L CC synth-2D]
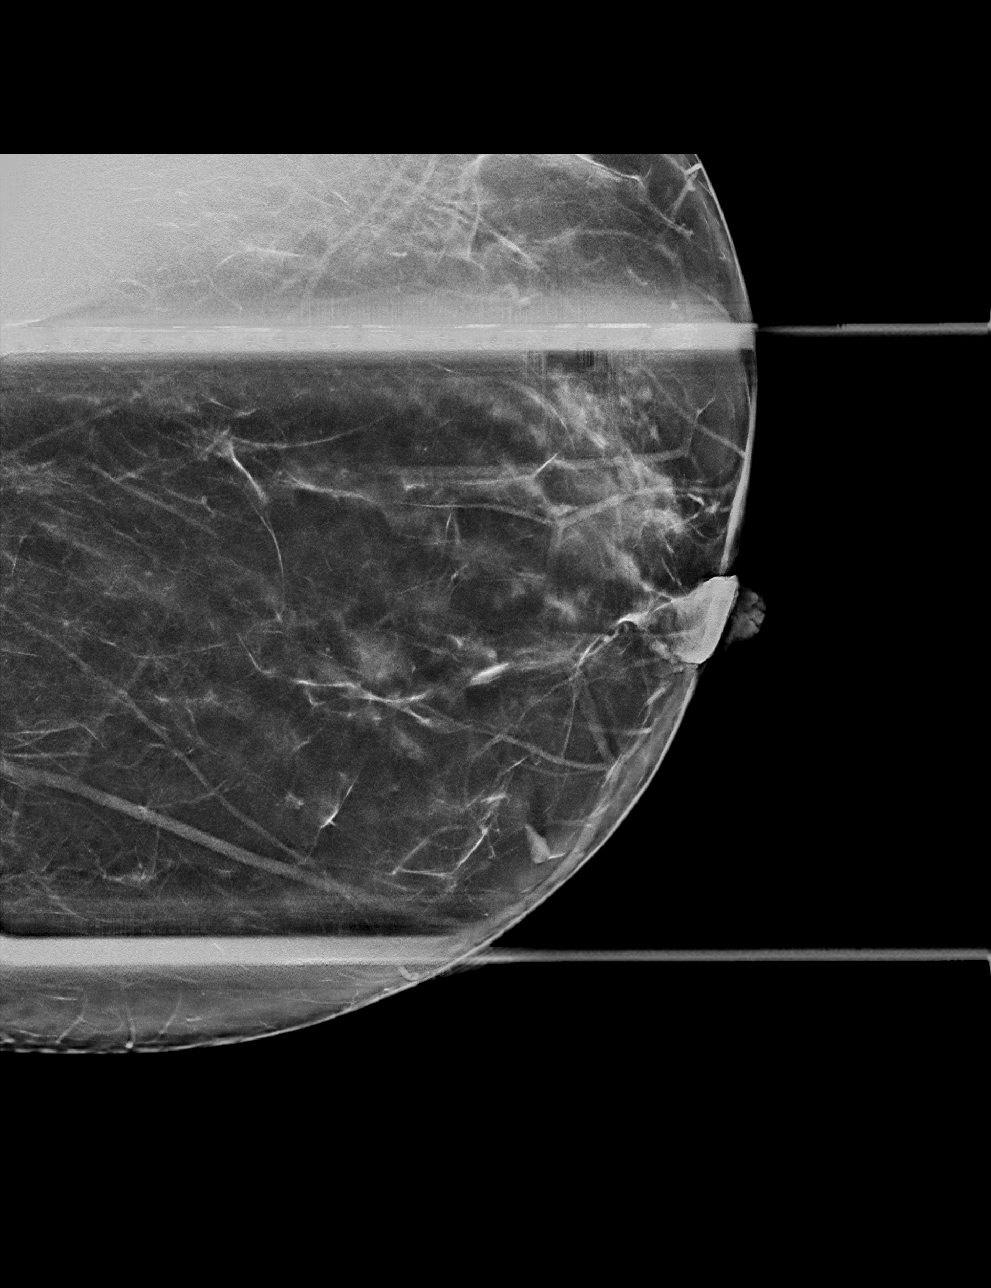

[L CC tomo · tomo slice 35/70.0]
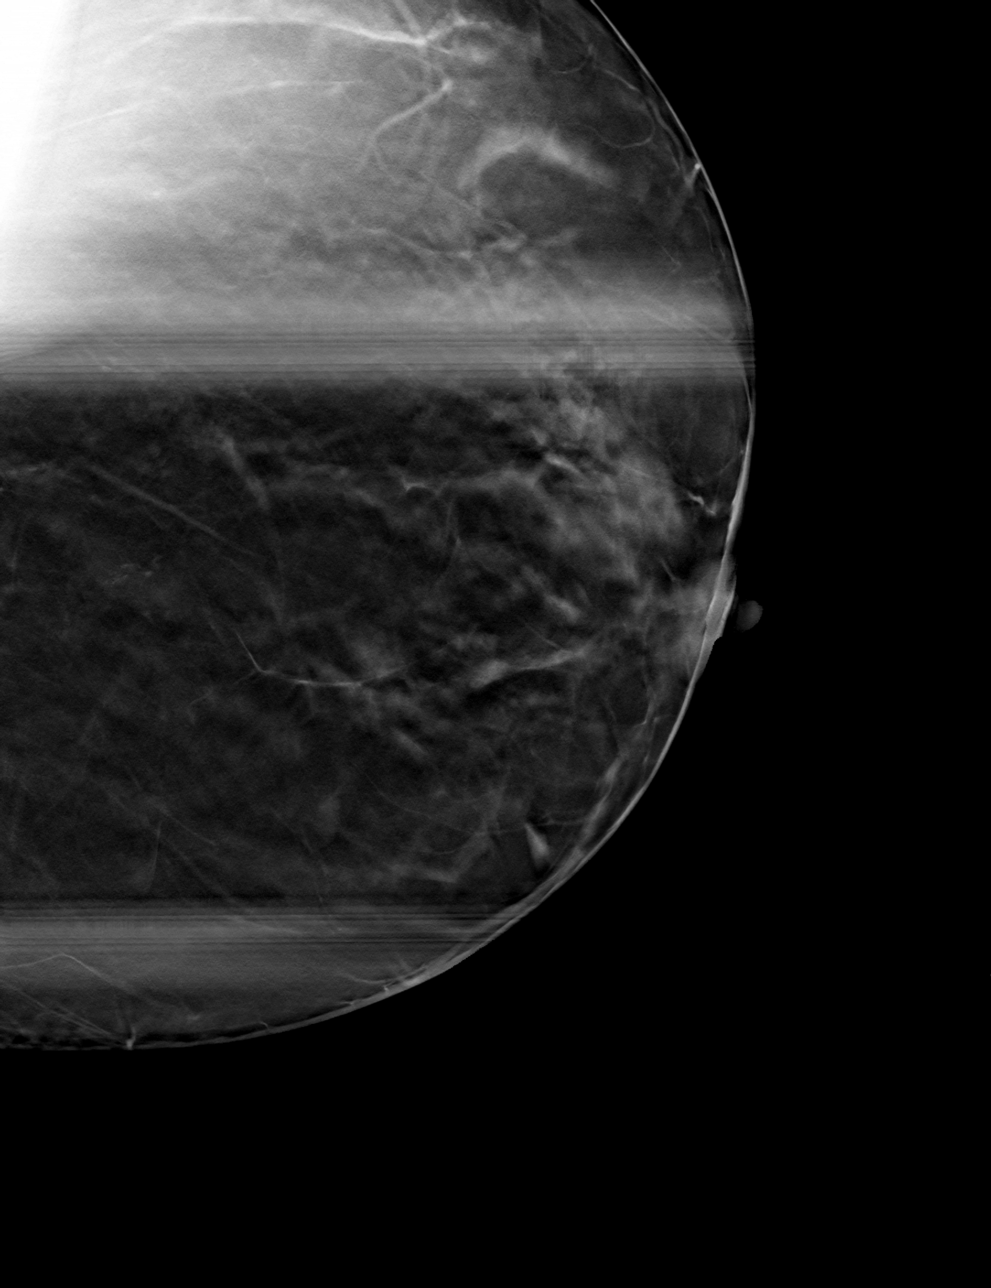

[L MLO tomo · tomo slice 41/82.0]
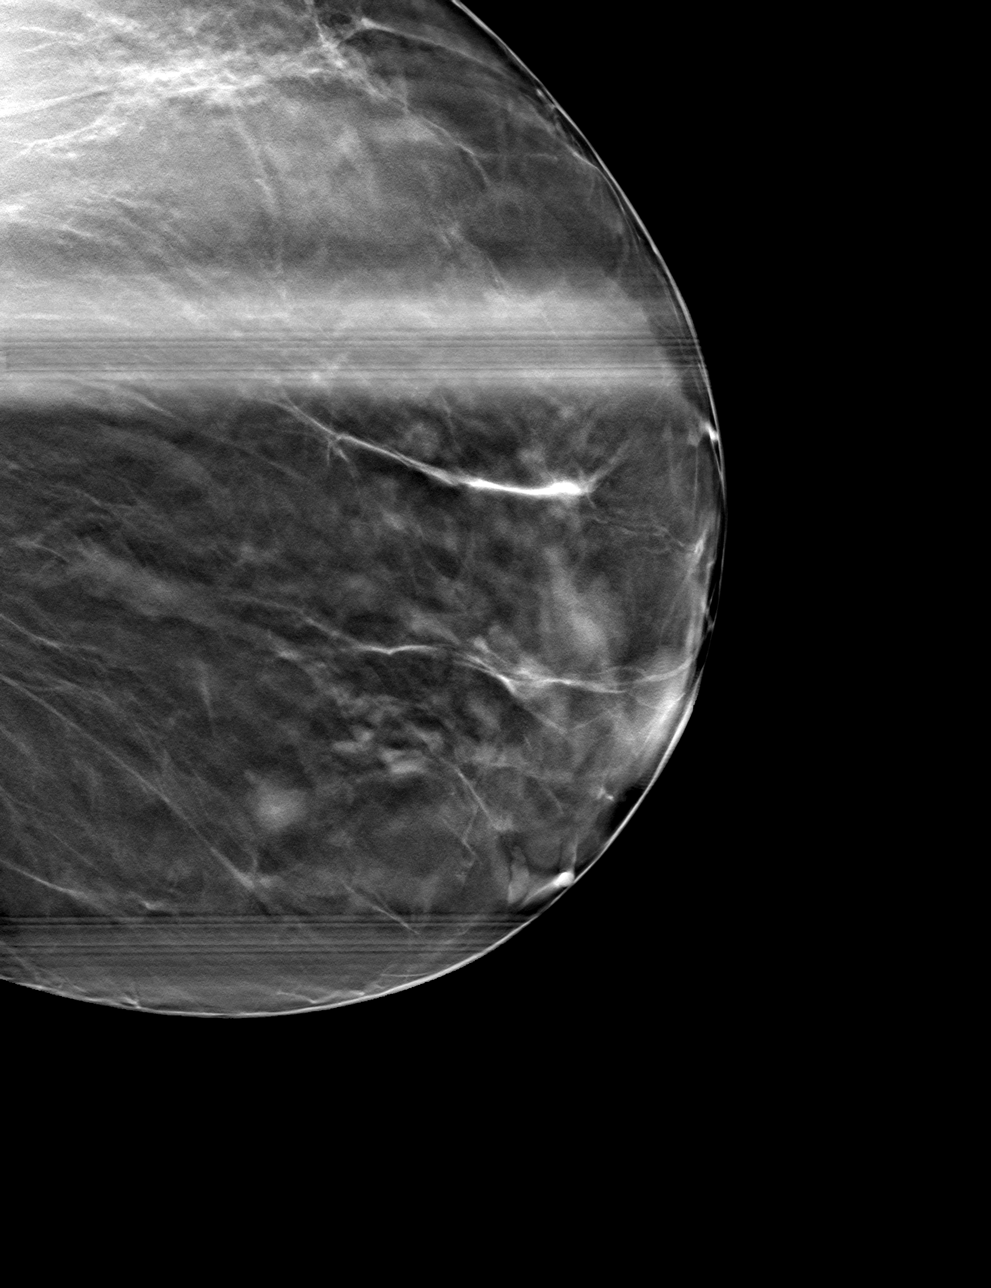

[4 of 12 positions shown; findings below may reference images not displayed]

ACR Breast Density Category b: There are scattered areas of
fibroglandular density.
FINDINGS: The mass in the medial inferior left breast is stable. Referring to
the screening mammogram, the mass in the lateral left breast is
stable. The anteromedial asymmetry in the left breast is stable as
well. No new or suspicious findings in the left breast.

Targeted ultrasound is performed, showing a mass in the left breast
at 7 o'clock, 3 cm from the nipple measuring 8 by 5 x 9 mm today,
not significantly changed since 7543.
IMPRESSION: The mass in the medial inferior left breast at 7 o'clock has been
stable for greater than 2 years. No additional follow-up necessary.
This mass is now considered benign. Other masses and asymmetries
described above have also been stable since 7543 requiring no
additional follow-up.

RECOMMENDATION:
Annual screening mammography.

I have discussed the findings and recommendations with the patient.
If applicable, a reminder letter will be sent to the patient
regarding the next appointment.

BI-RADS CATEGORY  2: Benign.
